# Patient Record
Sex: Female | Born: 1985 | Race: White | Hispanic: No | Marital: Single | Smoking: Never smoker
Health system: Southern US, Community
[De-identification: ages and names within clinical notes are randomized; demographics above are authoritative.]

## PROBLEM LIST (undated history)

## (undated) ENCOUNTER — Inpatient Hospital Stay (HOSPITAL_COMMUNITY): Payer: Self-pay

## (undated) DIAGNOSIS — I10 Essential (primary) hypertension: Secondary | ICD-10-CM

## (undated) HISTORY — PX: LEG SURGERY: SHX1003

---
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
## 2008-02-12 ENCOUNTER — Emergency Department (HOSPITAL_COMMUNITY): Admission: EM | Admit: 2008-02-12 | Discharge: 2008-02-12 | Payer: Self-pay | Admitting: Emergency Medicine

## 2008-03-04 ENCOUNTER — Emergency Department (HOSPITAL_COMMUNITY): Admission: EM | Admit: 2008-03-04 | Discharge: 2008-03-04 | Payer: Self-pay | Admitting: Emergency Medicine

## 2008-12-07 ENCOUNTER — Emergency Department (HOSPITAL_COMMUNITY): Admission: EM | Admit: 2008-12-07 | Discharge: 2008-12-07 | Payer: Self-pay | Admitting: Emergency Medicine

## 2010-05-13 LAB — URINALYSIS, ROUTINE W REFLEX MICROSCOPIC
Hgb urine dipstick: NEGATIVE
Ketones, ur: NEGATIVE mg/dL
Protein, ur: NEGATIVE mg/dL
Specific Gravity, Urine: 1.025 (ref 1.005–1.030)
pH: 6 (ref 5.0–8.0)

## 2010-05-13 LAB — GC/CHLAMYDIA PROBE AMP, GENITAL: GC Probe Amp, Genital: NEGATIVE

## 2010-05-13 LAB — WET PREP, GENITAL

## 2010-06-14 NOTE — Op Note (Signed)
NAME:  Nancy Morrison, QUASHIE NO.:  [id number]   MEDICAL RECORD NO.:  [id number]          PATIENT TYPE:  INP   LOCATION:  A410                          FACILITY:  [facility]   PHYSICIAN:  Langley Gauss, MD     DATE OF BIRTH:  05/31/1985   DATE OF PROCEDURE:  10/24/2004  DATE OF DISCHARGE:                                 OPERATIVE REPORT   DIAGNOSES:  1.  Intrauterine pregnancy at 40 weeks.  2.  Active outbreak of herpes simplex virus, type 2, recurrent.  40 week      intrauterine pregnancy.  3.  Is active rate of HSV type 2, recurrence at the patient's right inner,      remote from the vagina.  4.  Transient gestation hypertension of pregnancy with elevated blood      pressures noted during active stage of labor.  5.  Fetal macrosomia.   PROCEDURE:  1.  Placement of continuous lumbar epidural analgesia; start time 0300 on      October 24, 2004.  Discontinuation 0500 October 24, 2004.  Delivered      spontaneous delivery 8 pounds 13 ounces female infant.  2.  Midline episiotomy and repair.   SURGEON:  Langley Gauss, M.D.   ESTIMATED BLOOD LOSS:  Less than 500 cc.   COMPLICATIONS:  None.   SPECIMENS:  Arterial cord gas and cord blood to laboratory.  Placenta is  examined and noted be apparently intact with a three-vessel umbilical cord.   DESCRIPTION OF PROCEDURE:  Patient admitted for induction at [redacted] weeks  gestation on October 23, 2004.  Foley bulb catheter placed October 23, 2004.  This spontaneously fell out p.m. of October 23, 2004.  At the time  of amniotomy the patient noted be 5 cm dilatation.  This, however, is noted  to be passive dilatation through the efforts of the Foley bulb catheter as  it did require a significant amount of time beyond this with Pitocin  induction to achieve an adequate functional labor pain pattern.  The patient  initially did receive IV narcotics for pain relief but this was found to  provided inadequate analgesia.  She  was noted also to experience some  elevated blood pressures in the active phase of labor.  She was noted to be  negative for proteinuria upon admission and at all other times during the  prenatal course.  Thus she did not meet diagnostic criteria for  preeclampsia.  She did on three occasions receive 10 mg of IV Apresoline.  She did have a single maximally elevated blood pressure of 177/102, which  was obtained immediately during a contraction with significant pain  associated with this.  However, with placement of the epidural, the patient  had near normal placement of the was placed by the epidural the patient had  near normalization of blood pressures with no subsequent problem.  Epidural  was requested.  When I arrived to place the epidural, the nursing staff did  examine the patient and notified me she was 8 cm.  Epidural was placed out  difficulty. The patient  did have an urge to push while it was being placed  well as being placed, but after placement, she was examined noted be 9 cm  dilatation.  She did get an excellent bilateral block.  Subsequently, the  patient progressed rapidly through the transitional phase of labor. Upon  reaching complete dilatation, she had a strong urge to push.  She is placed  in the dorsal lithotomy position, prepped and draped in the usual sterile  manner.  Great care was taken to note the HSV type 2 lesion on the right the  inner thigh.  This was covered with a side. This is covered with a sterile  towel and thus the newborn did not come in contact with this.  By patient  history and upon clinical examination. there was noted to be no lesions  within the genital area.  The patient pushed well during the short second  stage of labor.  Scalp lead was cut and 10 cc of 1% lidocaine plain injected  in the midline of the peroneal body distension the perineum.  A small  midline episiotomy was performed.  The infant was delivered in a direct OA  position.   Mouth and nares were bulb suctioned of clear amniotic fluid.  Subsequently thereafter the patient pushed very poorly, thus there was  to  this there was some slight delay in delivery of the shoulders.  However,  when the patient did begin adequate expulsive efforts, spontaneous rotation  occurred to a left anterior shoulder position.  Gentle downward traction  combined with expulsive efforts resulted in delivery of this anterior  shoulder as well as the remainder the infant without difficulty.  V Mouth  and nares were again bulb suctioned of clear amniotic fluid. Umbilical cord  is milked toward the infant.  The cord was doubly clamped and cut.  Infant  is taken to the nursery staff for immediate assessment and is noted to have  a spontaneous and vigorous breathing and cry.  As previously, the left  shoulder was noted to be the anterior shoulder at the time of delivery.  The  infant appears to be moving both shoulders symmetrically.  After obtaining  arterial cord gas and cord blood, gentle traction umbilical cord results in  separation which upon examination appears to be an intact placenta,  associated three-vessel umbilical cord.  Examination of the genital tract  reveals midline episiotomy is not extended.  This was easily repaired  utilizing a 0 chromic in a running locked fashion on the vaginal mucosa  followed by a running layer of 2-0 Vicryl in the superficial transverse  perineal muscle.  The skin is completely closed utilizing a continuous  running layer of 0 chromic on the perineal body.  This restored the normal  anatomy.  The patient is taken out dorsal lithotomy position, rolled to her  side at which time the epidural catheter is removed with the blue tip noted  be intact.  Both mother and infant doing well following delivery.  The  patient does plan on attempting to both bottle and breast feed.  Ice pack is noted to the perineum, and she will be treated with p.o. Tylox for   postpartum analgesia.      Langley Gauss, MD  Electronically Signed     DC/MEDQ  D:  10/24/2004  T:  10/24/2004  Job:  [id number]

## 2010-06-14 NOTE — Op Note (Signed)
NAME:  Nancy Morrison, Nancy Morrison NO.:  [id number]   MEDICAL RECORD NO.:  [id number]          PATIENT TYPE:  INP   LOCATION:  LDR1                          FACILITY:  [facility]   PHYSICIAN:  Langley Gauss, MD     DATE OF BIRTH:  01/20/1986   DATE OF PROCEDURE:  10/23/2004  DATE OF DISCHARGE:                                 OPERATIVE REPORT   PROCEDURE:  Placement of Foley bulb for mechanical ripening of cervix prior  to amniotomy and induction. Procedure performed by Dr. Langley Gauss.   COMPLICATIONS:  None.   SPECIMENS:  None.   SUMMARY:  Appropriate informed consent was obtained. We had discussed  placement of Foley bulb for mechanical ripening during prenatal visits. The  patient is on external fetal monitor. She is noted to have no uterine  activity. Examination reveals normal uterine tone. Review of the external  fetal monitor strip does reveal fetal heart rate baseline of 150 with  accelerations noted greater than 15 beats per minute for greater than 15  seconds duration. No fetal heart rate decelerations. Normal long-term  variability.   ASSESSMENT:  Reactive nonstress test.   The patient had been examined two days previously in the office at which  time she was noted be about 1/2 cm dilated, vertex at a zero station, and  about 70% effaced. She is now placed in a dorsal dorsolithotomy position. A  sterile speculum examination is used to visualize the cervix. Notably, the  patient is noted be positive GBS carrier status. She is treated with IV  Cleocin at this time due to stated allergy to penicillin. The cervix  visualized. A soft 16-French Foley catheter is sterilely passed through the  endocervical os and then inflated to a volume of 50 cc of sterile normal  saline. Gentle traction does confirm a proper placement with the bulb of the  Foley just above the external cervical os. This was then taped to the  patient's left leg under gentle traction. The patient  tolerated the  procedure very well. She is taken out of the dorsal lithotomy position.  Review the external fetal monitor strip reveals no change in the fetal heart  rate. No change in uterine tone or uterine activity.   ASSESSMENT AND PLAN:  Will leave this in place for several hours' duration.  Hopefully upon removal, we will be able to proceed with amniotomy with a  much more favorable cervix for induction.      Langley Gauss, MD  Electronically Signed     DC/MEDQ  D:  10/23/2004  T:  10/23/2004  Job:  [id number]

## 2010-06-14 NOTE — Op Note (Signed)
NAME:  Nancy Morrison, FATE NO.:  [id number]   MEDICAL RECORD NO.:  [id number]          PATIENT TYPE:  INP   LOCATION:  A410                          FACILITY:  [facility]   PHYSICIAN:  Langley Gauss, MD     DATE OF BIRTH:  [birth year]   DATE OF PROCEDURE:  10/23/2004  DATE OF DISCHARGE:                                 OPERATIVE REPORT   The patient is evaluated at 28. She had a Foley bulb catheter placed in  the cervix previously. Just prior to my evaluation, the patient had been up  to use the rest room at which time the catheter had spontaneously fallen out  with the balloon still inflated. When I examination the patient, she was  noted be 5 cm dilated, 70% effaced, with the vertex at -1 station, posterior  but well applied to the cervix. Fetal scalp electrode was placed with  resultant amniotomy and findings of clear amniotic fluid and a reassuring  fetal heart rate. The patient is to be managed expectantly, subsequently  will receive Pitocin induction as clinically indicated. She is having  reassuring fetal heart rate by fetal scalp electrode at this time. She is  requesting initially IV narcotics for pain relief.      Langley Gauss, MD  Electronically Signed     DC/MEDQ  D:  10/24/2004  T:  10/24/2004  Job:  [id number]

## 2010-06-14 NOTE — Op Note (Signed)
NAME:  Nancy Morrison, HALK NO.:  [id number]   MEDICAL RECORD NO.:  [id number]          PATIENT TYPE:  INP   LOCATION:  A410                          FACILITY:  [facility]   PHYSICIAN:  Langley Gauss, MD     DATE OF BIRTH:  18-Feb-1985   DATE OF PROCEDURE:  DATE OF DISCHARGE:                                 OPERATIVE REPORT   PROCEDURE:  Placement of continuous lumbar epidural analgesia.   START TIME:  0300.   Appropriate informed consent was obtained.  The patient is having inadequate  therapy for relief from the IV narcotics, thus she requested epidural. She  is noted be 8 cm dilated per the nursing staff. She is placed in a seated  position. Bony landmarks identified. The back is sterilely prepped with the  epidural tray, 5 mL, 1% lidocaine injected at the midline and the L3-L4  interspace to raise a small skin wheal.  The 17 gauge Tuohy Schliff needle  was then utilized with loss resistance and air-filled glass syringe to  identify atraumatic entry in the epidural space. On the first attempt the  bony spinous process was encountered. Thus, the epidural needle was  withdrawn and directed slightly caudad. On the second attempt excellent loss  resistance was noted. No signs of CSF or intravascular entry obtained, 5 mL  1.5% lidocaine plus epinephrine injected through the epidural needle. No  signs of CSF, or intravascular injection obtained, thus the catheter was  inserted to depth of 5 cm.  Epidural needle was removed. Aspiration test was  negative, second test dose 2 mL 1.5% lidocaine plus epinephrine injected  through the epidural catheter. Again no signs of CSF or intravascular  injection obtained. Thus, the catheter was secured in place. The patient was  returned to the left lateral supine position, connected to the infusion pump  with the standard mixture at a rate of 14 mL/hour. Examination reveals 9 cm  dilatation, zero station completely effaced, with a reassuring  fetal heart  rate. The patient at this time appears to have excellent therapeutic results  with no urge to push at this time.      Langley Gauss, MD  Electronically Signed     DC/MEDQ  D:  10/24/2004  T:  10/24/2004  Job:  [id number]

## 2010-06-14 NOTE — Discharge Summary (Signed)
NAME:  Nancy Morrison, ROTTER NO.:  [id number]   MEDICAL RECORD NO.:  [id number]          PATIENT TYPE:  INP   LOCATION:  A410                          FACILITY:  [facility]   PHYSICIAN:  Langley Gauss, MD     DATE OF BIRTH:  08-13-1985   DATE OF ADMISSION:  10/23/2004  DATE OF DISCHARGE:  09/30/2006LH                                 DISCHARGE SUMMARY   She was given a copy of standard discharge instructions.  She is to follow  up in the office in four week's time.  She is bottle feeding at the time of  discharge.   PERTINENT DATES:  Date of admission, October 23, 2004;  placement of Foley  bulb, October 23, 2004;  placement of epidural, October 24, 2004;  vaginal delivery, September 28/2006;  discharge, October 26, 2004.   DIAGNOSES:  1.  Active recurrence of herpes simplex virus type 2 in the right inner      thigh, remote from the site of delivery.  2.  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  3.  Transient gestational hypertension of pregnancy with elevated blood      pressures noted during the active stage of labor and in the immediate      postpartum period.  The patient was treated in the postpartum state with      labetalol 100 mg p.o. b.i.d.  She had received during the course of      labor three 10 mg IV dosages of apresoline.   DELIVERED:  Viable vigorous female infant.   The patient has her coverage through [employer].  It is unclear whether this  provides circumcision as a covered service.  The patient herself reportedly  had no idea regarding coverage or non-coverage of said service, nor was she  able to make any financial arrangements at the time of the hospitalization.  Thus, infant's circumcision is not performed at time of discharge.  The  patient will have to make arrangements to follow up within the next two  weeks for performance of the circumcision.   PERTINENT LABORATORY STUDIES:  Hemoglobin 11.6, hematocrit 33.4, with a  white count 9.4.  On  postpartum day #1, 11/32.2 with a white count of 18.1.  Postpartum day #2, hemoglobin 11.5, hematocrit 32.9, with a white count of  14.2.   HOSPITAL COURSE:  See previous dictations.  The patient delivered in the  very early a.m. of October 24, 2004.  Postpartum, she did well.  She did  have some elevated blood pressures for which she was treated with the p.o.  labetalol,  and hospitalization continued x48 hours postpartum.  The patient  had good results with labetalol for her blood pressure, and she is now  discharged to home at this time in my absence by Tilda Burrow, M.D.  according to cross coverage arrangements.      Langley Gauss, MD  Electronically Signed     DC/MEDQ  D:  10/26/2004  T:  10/26/2004  Job:  [id number]

## 2010-06-14 NOTE — H&P (Signed)
NAME:  Nancy Morrison, NEIGHBORS NO.:  [id number]   MEDICAL RECORD NO.:  [id number]          PATIENT TYPE:  INP   LOCATION:  LDR1                          FACILITY:  [facility]   PHYSICIAN:  Langley Gauss, MD     DATE OF BIRTH:  15-Mar-1985   DATE OF ADMISSION:  DATE OF DISCHARGE:  LH                                HISTORY & PHYSICAL   HISTORY:  [age]-year-old gravida 1, para 0 at [redacted] weeks gestation was  admitted for induction of labor.  Patient's prenatal course has been  complicated by adolescent pregnancy, in addition she does not know the  father of the baby, she is only a Consulting civil engineer at [employer],  she is noted to be GBS carrier status positive, will require treatment  during the course of labor.  Initially her AFP extra triple screen was noted  to be abnormal however following an ultrasound at [facility] and a  corrected gestational age AP triple screen was noted to be within normal  limits.  Glucose tolerance test was normal at 114.   PAST MEDICAL HISTORY:  Patient does have a history of fracture of her left  ankle with no residual functional deficit.  Past medical history is  otherwise negative.  No other medical or surgical history.   ALLERGIES:  She has no known drug allergies.   CURRENT MEDICATIONS:  Prenatal vitamins.   SOCIAL HISTORY:  Patient is a nonsmoker.  Attends [employer].  As stated previously, she does not know who the father of the baby  is.   PHYSICAL EXAMINATION:  VITAL SIGNS:  Height 5 feet 8 inches.  Prepregnancy  is 203, most recent weight is 235.  Blood pressure is 141/94, pulse rate of  80, respiratory rate is 20.  HEENT:  Negative.  No adenopathy.  Neck is supple.  Thyroid is nonpalpable.  CHEST:  Lungs are clear.  Cardiovascular exam is regular rate and rhythm.  ABDOMEN:  Soft and nontender.  No surgical scars are identified.  EXTREMITIES:  Noted to be with only trace edema.  PELVIC EXAM:   Normal external genitalia.  No lesions or ulcerations  identified.  No vaginal bleeding or leakage of fluid.  Cervix is noted to be  fingertip, vertex at about a 0 station and about 70% effaced.  Fetal heart  tones auscultated in the 150s.  EXTREMITIES:  Patient complains of a sore on her right inner thigh.  Of  note, the area is near the right groin area but is clearly separate and  distinct from the labia and vaginal area.  It occurs down the medial aspect  of the right anterior thigh.  It is noted to actually be about 6 inches from  the groin fold.  It has very minimal weeping and no purulence identified.  Cultures performed of this area for HSV.  Patient describes one other  occasion where she had a similar type lesion.  She feels as though it  occurred on the other leg and attributes this current lesion to chafing of  the legs  together with walking.   ASSESSMENT AND PLAN:  Herpes simplex virus cultures performed of the sore on  the right inner thigh although it must be noted that this is remote from the  site of delivery thus the culture results obtained from this should not play  a component in the routed delivery.  Cervix is noted to be slightly  unfavorable thus she will be referred to [facility] on October 23, 2004 at 0800 hours at which time we can initially proceed with placement  of  Foley bulb for mechanical ripening of the cervix followed by amniotomy.  As  stated previously patient is noted to be GBS carrier status positive, will  require treatment during the course of labor in an effort to prevent  vertical transmission to the infant.      Langley Gauss, MD  Electronically Signed     DC/MEDQ  D:  10/21/2004  T:  10/21/2004  Job:  [id number]

## 2011-03-11 ENCOUNTER — Emergency Department (HOSPITAL_COMMUNITY): Payer: Medicaid Other

## 2011-03-11 ENCOUNTER — Emergency Department (HOSPITAL_COMMUNITY)
Admission: EM | Admit: 2011-03-11 | Discharge: 2011-03-11 | Disposition: A | Discharge status: Home / Self Care | Payer: Medicaid Other | Attending: Family Medicine | Admitting: Family Medicine

## 2011-03-11 ENCOUNTER — Encounter (HOSPITAL_COMMUNITY): Payer: Self-pay | Admitting: *Deleted

## 2011-03-11 DIAGNOSIS — L089 Local infection of the skin and subcutaneous tissue, unspecified: Secondary | ICD-10-CM | POA: Insufficient documentation

## 2011-03-11 DIAGNOSIS — M545 Low back pain, unspecified: Secondary | ICD-10-CM | POA: Insufficient documentation

## 2011-03-11 NOTE — ED Provider Notes (Signed)
History     CSN: [id number]  Arrival date & time 03/11/11  8657   First MD Initiated Contact with Patient 03/11/11 [phone]      Chief Complaint  Patient presents with  . drainage from umbilicus     (Consider location/radiation/quality/duration/timing/severity/associated sxs/prior treatment) HPI Comments: Pt notes a small amount of purulent d/c from umbilicus for the past few days.  She also has been having lower back pain since before christmas.  No trauma and no  UTI sxs.  No fever or chills.  The history is provided by the patient. No language interpreter was used.    History reviewed. No pertinent past medical history.  Past Surgical History  Procedure Date  . Leg surgery     left leg    No family history on file.  History  Substance Use Topics  . Smoking status: Never Smoker   . Smokeless tobacco: Not on file  . Alcohol Use: No    OB History    Grav Para Term Preterm Abortions TAB SAB Ect Mult Living                  Review of Systems  Constitutional: Negative for fever.  Musculoskeletal: Positive for back pain.  Skin: Positive for wound.  All other systems reviewed and are negative.    Allergies  Penicillins  Home Medications   Current Outpatient Rx  Name Route Sig Dispense Refill  . LEVONORGESTREL 20 MCG/24HR IU IUD Intrauterine 1 each by Intrauterine route once.      BP 125/79  Pulse 63  Temp(Src) 98.2 F (36.8 C) (Oral)  Resp 16  Ht 5\' 6"  (1.676 m)  Wt 262 lb (118.842 kg)  BMI 42.29 kg/m2  SpO2 99%  Physical Exam  Nursing note and vitals reviewed. Constitutional: She is oriented to person, place, and time. She appears well-developed and well-nourished. No distress.  HENT:  Head: Normocephalic and atraumatic.  Eyes: EOM are normal.  Neck: Normal range of motion.  Cardiovascular: Normal rate, regular rhythm and normal heart sounds.   Pulmonary/Chest: Effort normal and breath sounds normal.  Abdominal: Soft. She exhibits no distension.  There is no tenderness.    Musculoskeletal: Normal range of motion.       Back:  Neurological: She is alert and oriented to person, place, and time.  Skin: Skin is warm and dry.  Psychiatric: She has a normal mood and affect. Judgment normal.    ED Course  Procedures (including critical care time)  Labs Reviewed - No data to display No results found.   No diagnosis found.    MDM          Worthy Rancher, PA 03/11/11 1254

## 2011-03-11 NOTE — Discharge Instructions (Signed)
Back Pain, Adult Low back pain is very common. About 1 in 5 people have back pain.The cause of low back pain is rarely dangerous. The pain often gets better over time.About half of people with a sudden onset of back pain feel better in just 2 weeks. About 8 in 10 people feel better by 6 weeks.  CAUSES Some common causes of back pain include:  Strain of the muscles or ligaments supporting the spine.   Wear and tear (degeneration) of the spinal discs.   Arthritis.   Direct injury to the back.  DIAGNOSIS Most of the time, the direct cause of low back pain is not known.However, back pain can be treated effectively even when the exact cause of the pain is unknown.Answering your caregiver's questions about your overall health and symptoms is one of the most accurate ways to make sure the cause of your pain is not dangerous. If your caregiver needs more information, he or she may order lab work or imaging tests (X-rays or MRIs).However, even if imaging tests show changes in your back, this usually does not require surgery. HOME CARE INSTRUCTIONS For many people, back pain returns.Since low back pain is rarely dangerous, it is often a condition that people can learn to manageon their own.   Remain active. It is stressful on the back to sit or stand in one place. Do not sit, drive, or stand in one place for more than 30 minutes at a time. Take short walks on level surfaces as soon as pain allows.Try to increase the length of time you walk each day.   Do not stay in bed.Resting more than 1 or 2 days can delay your recovery.   Do not avoid exercise or work.Your body is made to move.It is not dangerous to be active, even though your back may hurt.Your back will likely heal faster if you return to being active before your pain is gone.   Pay attention to your body when you bend and lift. Many people have less discomfortwhen lifting if they bend their knees, keep the load close to their  bodies,and avoid twisting. Often, the most comfortable positions are those that put less stress on your recovering back.   Find a comfortable position to sleep. Use a firm mattress and lie on your side with your knees slightly bent. If you lie on your back, put a pillow under your knees.   Only take over-the-counter or prescription medicines as directed by your caregiver. Over-the-counter medicines to reduce pain and inflammation are often the most helpful.Your caregiver may prescribe muscle relaxant drugs.These medicines help dull your pain so you can more quickly return to your normal activities and healthy exercise.   Put ice on the injured area.   Put ice in a plastic bag.   Place a towel between your skin and the bag.   Leave the ice on for 15 to 20 minutes, 3 to 4 times a day for the first 2 to 3 days. After that, ice and heat may be alternated to reduce pain and spasms.   Ask your caregiver about trying back exercises and gentle massage. This may be of some benefit.   Avoid feeling anxious or stressed.Stress increases muscle tension and can worsen back pain.It is important to recognize when you are anxious or stressed and learn ways to manage it.Exercise is a great option.  SEEK MEDICAL CARE IF:  You have pain that is not relieved with rest or medicine.   You have   pain that does not improve in 1 week.   You have new symptoms.   You are generally not feeling well.  SEEK IMMEDIATE MEDICAL CARE IF:   You have pain that radiates from your back into your legs.   You develop new bowel or bladder control problems.   You have unusual weakness or numbness in your arms or legs.   You develop nausea or vomiting.   You develop abdominal pain.   You feel faint.  Document Released: 01/13/2005 Document Revised: 09/25/2010 Document Reviewed: 06/03/2010 [facility] Patient Information 2012 [place], [place].   Wash your belly-button twice daily with soap and water then apply  antibiotic ointment.  The lumbar x-rays are normal.  Follow up with your MD as needed.

## 2011-03-11 NOTE — ED Notes (Signed)
Yellow Drainage from umbilicus with foul smelling odor since yesterday.

## 2011-03-11 NOTE — ED Provider Notes (Signed)
Medical screening examination/treatment/procedure(s) were performed by non-physician practitioner and as supervising physician I was immediately available for consultation/collaboration. Devoria Albe, MD, Armando Gang   Ward Givens, MD 03/11/11 [phone]

## 2011-12-05 ENCOUNTER — Encounter (HOSPITAL_COMMUNITY): Payer: Self-pay | Admitting: *Deleted

## 2011-12-05 ENCOUNTER — Emergency Department (HOSPITAL_COMMUNITY)
Admission: EM | Admit: 2011-12-05 | Discharge: 2011-12-05 | Disposition: A | Discharge status: Home / Self Care | Payer: Medicaid Other | Attending: Emergency Medicine | Admitting: Emergency Medicine

## 2011-12-05 ENCOUNTER — Emergency Department (HOSPITAL_COMMUNITY): Payer: Medicaid Other

## 2011-12-05 DIAGNOSIS — N200 Calculus of kidney: Secondary | ICD-10-CM | POA: Insufficient documentation

## 2011-12-05 DIAGNOSIS — R112 Nausea with vomiting, unspecified: Secondary | ICD-10-CM | POA: Insufficient documentation

## 2011-12-05 LAB — WET PREP, GENITAL
Trich, Wet Prep: NONE SEEN
Yeast Wet Prep HPF POC: NONE SEEN

## 2011-12-05 LAB — COMPREHENSIVE METABOLIC PANEL
BUN: 12 mg/dL (ref 6–23)
Chloride: 99 mEq/L (ref 96–112)
Creatinine, Ser: 0.94 mg/dL (ref 0.50–1.10)
GFR calc Af Amer: >90 mL/min (ref >90)
GFR calc non Af Amer: 84 mL/min — ABNORMAL LOW (ref >90)
Glucose, Bld: 111 mg/dL — ABNORMAL HIGH (ref 70–99)
Potassium: 3.8 mEq/L (ref 3.5–5.1)
Total Bilirubin: 0.4 mg/dL (ref 0.3–1.2)

## 2011-12-05 LAB — CBC WITH DIFFERENTIAL/PLATELET
HCT: 38.9 % (ref 36.0–46.0)
Lymphs Abs: 1.8 10*3/uL (ref 0.7–4.0)
MCH: 29.8 pg (ref 26.0–34.0)
MCHC: 33.7 g/dL (ref 30.0–36.0)
MCV: 88.6 fL (ref 78.0–100.0)
Neutrophils Relative %: 74 % (ref 43–77)
Platelets: 280 10*3/uL (ref 150–400)
RBC: 4.39 MIL/uL (ref 3.87–5.11)
WBC: 12.9 10*3/uL — ABNORMAL HIGH (ref 4.0–10.5)

## 2011-12-05 LAB — URINE MICROSCOPIC-ADD ON

## 2011-12-05 LAB — URINALYSIS, ROUTINE W REFLEX MICROSCOPIC
Ketones, ur: NEGATIVE mg/dL
Protein, ur: NEGATIVE mg/dL
Specific Gravity, Urine: 1.02 (ref 1.005–1.030)
Urobilinogen, UA: 0.2 mg/dL (ref 0.0–1.0)

## 2011-12-05 MED ORDER — HYDROMORPHONE HCL PF 1 MG/ML IJ SOLN
1.0000 mg | Freq: Once | INTRAMUSCULAR | Status: AC
  Administered 2011-12-05: 1 mg via INTRAVENOUS
  Filled 2011-12-05: qty 1

## 2011-12-05 MED ORDER — OXYCODONE-ACETAMINOPHEN 5-325 MG PO TABS: 1.0000 | ORAL_TABLET | Freq: Four times a day (QID) | ORAL | Status: DC | PRN

## 2011-12-05 MED ORDER — ONDANSETRON HCL 4 MG/2ML IJ SOLN
4.0000 mg | Freq: Once | INTRAMUSCULAR | Status: AC
  Administered 2011-12-05: 4 mg via INTRAVENOUS
  Filled 2011-12-05: qty 2

## 2011-12-05 MED ORDER — SODIUM CHLORIDE 0.9 % IV BOLUS (SEPSIS)
1000.0000 mL | Freq: Once | INTRAVENOUS | Status: AC
  Administered 2011-12-05: 1000 mL via INTRAVENOUS

## 2011-12-05 NOTE — ED Notes (Signed)
Pt alert & oriented x4, stable gait. Patient given discharge instructions, paperwork & prescription(s). Patient  instructed to stop at the registration desk to finish any additional paperwork. Patient verbalized understanding. Pt left department w/ no further questions. 

## 2011-12-05 NOTE — ED Notes (Signed)
Pt states abdominal pain described as constant cramping which began at 1500. States she has vomited x 4 since pain began . Pt states pain radiates around back and into pelvic area.

## 2011-12-05 NOTE — ED Notes (Signed)
Patient transported to CT 

## 2011-12-05 NOTE — ED Provider Notes (Signed)
History   This chart was scribed for Nancy B. Bernette Mayers, MD by Toya Smothers, ED Scribe. The patient was seen in room APA04/APA04. Patient's care was started at 1709.  CSN: [id number]  Arrival date & time 12/05/11  1709   First MD Initiated Contact with Patient 12/05/11 1747      Chief Complaint  Patient presents with  . Abdominal Pain  . Emesis    HPI  Nancy Morrison is a 26 y.o. female who presents to the Emergency Department complaining of 3 hours of new, unchanged, constant, moderate generalized abdominal pain with associated vomiting. Onset occurred at rest. Pt denies injury mechanism. Pain is dull, radiating to the back, described as "a constant contraction," and neither alleviated or aggravated by anything. Symptoms have not been treated PTA. No diarrhea, fever, vaginal bleeding, vaginal discharge, chills, cough, congestion, rhinorrhea, chest pain, SOB. Medical Hx includes enlarged ovarian cysts. She is currently using Mirena birth control. Pt reports having normal Pap smear results in September.    History reviewed. No pertinent past medical history.  Past Surgical History  Procedure Date  . Leg surgery     left leg    No family history on file.  History  Substance Use Topics  . Smoking status: Never Smoker   . Smokeless tobacco: Not on file  . Alcohol Use: No    Review of Systems  Gastrointestinal: Positive for nausea, vomiting and abdominal pain. Negative for diarrhea, constipation and blood in stool.  Genitourinary: Negative for dysuria, hematuria, decreased urine volume, vaginal bleeding, vaginal discharge, difficulty urinating and vaginal pain.  All other systems reviewed and are negative.    Allergies  Penicillins  Home Medications   Current Outpatient Rx  Name  Route  Sig  Dispense  Refill  . PHENYLEPHRINE-DM-GG-APAP 5-10-200-325 MG PO TABS   Oral   Take 2 tablets by mouth 2 (two) times daily as needed. FOR COLD AND FLU         . LEVONORGESTREL 20  MCG/24HR IU IUD   Intrauterine   1 each by Intrauterine route once.           BP 148/89  Pulse 75  Temp 98.2 F (36.8 C) (Oral)  Resp 16  Ht 5\' 7"  (1.702 m)  Wt 262 lb (118.842 kg)  BMI 41.03 kg/m2  SpO2 100%  Physical Exam  Nursing note and vitals reviewed. Constitutional: She is oriented to person, place, and time. She appears well-developed and well-nourished.  HENT:  Head: Normocephalic and atraumatic.  Eyes: EOM are normal. Pupils are equal, round, and reactive to light.  Neck: Normal range of motion. Neck supple.  Cardiovascular: Normal rate, normal heart sounds and intact distal pulses.   Pulmonary/Chest: Effort normal and breath sounds normal.  Abdominal: Soft. Bowel sounds are normal. She exhibits no distension and no mass. There is no tenderness. There is no rebound and no guarding.  Genitourinary: Right adnexum displays no mass and no tenderness. Left adnexum displays no mass and no tenderness. No vaginal discharge found.       Unable to visualize cervix  Musculoskeletal: Normal range of motion. She exhibits no edema and no tenderness.  Neurological: She is alert and oriented to person, place, and time. She has normal strength. No cranial nerve deficit or sensory deficit.  Skin: Skin is warm and dry. No rash noted.  Psychiatric: She has a normal mood and affect.    ED Course  Procedures DIAGNOSTIC STUDIES: Oxygen Saturation is 100% on room  air, normal by my interpretation.    COORDINATION OF CARE: 18:03- Evaluated Pt. Pt is awake, alert, and without distress. 18:07- Ordered CBC with differential, Comprehensive Metabolic Panel, Lipase Blood, and Urine Culture. 19:58- Ordered CT Abdomen Pelvis Wo Contrast 1 time imaging.   Labs Reviewed  URINALYSIS, ROUTINE W REFLEX MICROSCOPIC - Abnormal; Notable for the following:    Hgb urine dipstick LARGE (*)     All other components within normal limits  CBC WITH DIFFERENTIAL - Abnormal; Notable for the following:     WBC 12.9 (*)     Neutro Abs 9.5 (*)     Monocytes Absolute 1.1 (*)     All other components within normal limits  COMPREHENSIVE METABOLIC PANEL - Abnormal; Notable for the following:    Sodium 133 (*)     Glucose, Bld 111 (*)     GFR calc non Af Amer 84 (*)     All other components within normal limits  URINE MICROSCOPIC-ADD ON - Abnormal; Notable for the following:    Squamous Epithelial / LPF MANY (*)     Bacteria, UA FEW (*)     All other components within normal limits  PREGNANCY, URINE  LIPASE, BLOOD  WET PREP, GENITAL  GC/CHLAMYDIA PROBE AMP  URINE CULTURE   Ct Abdomen Pelvis Wo Contrast  12/05/2011  *RADIOLOGY REPORT*  Clinical Data: Flank pain, hematuria, evaluate for renal stone  CT ABDOMEN AND PELVIS WITHOUT CONTRAST  Technique:  Multidetector CT imaging of the abdomen and pelvis was performed following the standard protocol without intravenous contrast.  Comparison: None.  Findings:  The lack of intravenous contrast limits the ability to evaluate solid abdominal organs.  There is an approximately 3 mm opacity in the expected location of the right UVJ, possibly within the adjacent the urinary bladder. This findings associated with mild upstream ureterectasis.  No definite right-sided pelvocaliectasis.  No additional renal stones are seen within either kidney or within the expected location of the left ureter.  Normal hepatic contour.  Normal noncontrast appearance of the gallbladder.  No ascites.  Normal noncontrast appearance of the bilateral adrenal glands, pancreas and spleen.  The bowel is normal in course and caliber without wall thickening or evidence of obstruction.  Normal appearance of the appendix.  No pneumoperitoneum, pneumatosis or portal venous gas.  Retroflexed uterus.  IUD within the uterus.  Possible 2.8 x 4.4 cm right side adnexal cyst.  No definite left-sided adnexal lesion. No free fluid in the pelvis.  Limited visualization of the lower thorax demonstrates minimal  bibasilar opacities favored to represent atelectasis.  No focal air space opacities.  No pleural effusion.  Normal heart size.  No pericardial effusion.  No acute or aggressive osseous abnormalities.  IMPRESSION: 1.  Approximately 3 mm stone within the expected location of the right UVJ, possibly passed into the adjacent urinary bladder.  This finding is associated with mild upstream ureterectasis. 2.  No other renal stones are identified.  No evidence of left- sided urinary obstruction.  3.  IUD within the uterus.  Possible right-sided adnexal cyst.   Original Report Authenticated By: Tacey Ruiz, MD      No diagnosis found.    MDM  UA with hematuria, CT shows renal stone. No other concerning abnormalities. Advised to take pain meds as needed and followup with Urology if symptoms persist.      I personally performed the services described in this documentation, which was scribed in my presence. The recorded information  has been reviewed and is accurate.         Nancy B. Bernette Mayers, MD 12/05/11 2132

## 2011-12-05 NOTE — ED Notes (Signed)
Patient unable to give urine specimen at this time.  Stated that she ask Triage nurse about giving a urine specimen and she stated for her to wait.  Patient began coughing and "peed on herself".    Advised patient that when she can give [place] a specimen to ring call bell.

## 2011-12-07 LAB — URINE CULTURE: Colony Count: 15000

## 2011-12-09 LAB — GC/CHLAMYDIA PROBE AMP, GENITAL: Chlamydia, DNA Probe: NEGATIVE

## 2012-08-26 ENCOUNTER — Emergency Department (HOSPITAL_COMMUNITY)
Admission: EM | Admit: 2012-08-26 | Discharge: 2012-08-27 | Disposition: A | Discharge status: Home / Self Care | Payer: Medicaid Other | Attending: Emergency Medicine | Admitting: Emergency Medicine

## 2012-08-26 ENCOUNTER — Encounter (HOSPITAL_COMMUNITY): Payer: Self-pay | Admitting: *Deleted

## 2012-08-26 DIAGNOSIS — Z79899 Other long term (current) drug therapy: Secondary | ICD-10-CM | POA: Insufficient documentation

## 2012-08-26 DIAGNOSIS — M25569 Pain in unspecified knee: Secondary | ICD-10-CM | POA: Insufficient documentation

## 2012-08-26 DIAGNOSIS — Z880 Allergy status to penicillin: Secondary | ICD-10-CM | POA: Insufficient documentation

## 2012-08-26 DIAGNOSIS — R42 Dizziness and giddiness: Secondary | ICD-10-CM | POA: Insufficient documentation

## 2012-08-26 DIAGNOSIS — R1110 Vomiting, unspecified: Secondary | ICD-10-CM | POA: Insufficient documentation

## 2012-08-26 DIAGNOSIS — R109 Unspecified abdominal pain: Secondary | ICD-10-CM | POA: Insufficient documentation

## 2012-08-26 DIAGNOSIS — M25561 Pain in right knee: Secondary | ICD-10-CM

## 2012-08-26 DIAGNOSIS — M549 Dorsalgia, unspecified: Secondary | ICD-10-CM

## 2012-08-26 MED ORDER — NAPROXEN 250 MG PO TABS: 500.0000 mg | ORAL_TABLET | Freq: Once | ORAL | Status: DC

## 2012-08-26 NOTE — ED Provider Notes (Signed)
CSN: [id number]     Arrival date & time 08/26/12  2148 History  This chart was scribed for [employer]. Colon Branch, MD by Bennett Scrape, ED Scribe. This patient was seen in room APA19/APA19 and the patient's care was started at 11:02 PM.   First MD Initiated Contact with Patient 08/26/12 2258     Chief Complaint  Patient presents with  . Abdominal Pain  . Dizziness  . Nausea    The history is provided by the patient. No language interpreter was used.    HPI Comments: Nancy Morrison is a 27 y.o. female who presents to the Emergency Department complaining of intermittent bilateral abdominal described as sharp with associated bilateral back pain that occur with lifting. She also reports lightheadedness but states that she holds her breath when she lifts. 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 Pt denies taking OTC medications at home to improve symptoms. She denies any other symptoms currently. Pt does not have a h/o chronic medical conditions.   History reviewed. No pertinent past medical history.  Past Surgical History  Procedure Laterality Date  . Leg surgery      left leg   No family history on file. History  Substance Use Topics  . Smoking status: Never Smoker   . Smokeless tobacco: Not on file  . Alcohol Use: No   No OB history provided.  Review of Systems  Constitutional: Negative for fever.       Per HPI, otherwise negative  HENT: Negative for congestion.   Eyes: Negative for discharge and redness.  Respiratory: Negative for cough and shortness of breath.   Cardiovascular: Negative for chest pain.  Gastrointestinal: Positive for abdominal pain. Negative for vomiting.  Genitourinary:       Neg aside from HPI   Musculoskeletal: Positive for back pain.  Skin: Negative for rash.  Neurological: Positive for light-headedness. Negative for syncope, numbness and headaches.  Psychiatric/Behavioral:       No  behavior change    Allergies  Lactose intolerance (gi) and Penicillins  Home Medications   Current Outpatient Rx  Name  Route  Sig  Dispense  Refill  . levonorgestrel (MIRENA) 20 MCG/24HR IUD   Intrauterine   1 each by Intrauterine route once.          Triage Vitals: BP 140/89  Pulse 83  Temp(Src) 98.3 F (36.8 C) (Oral)  Ht 5\' 5"  (1.651 m)  Wt 268 lb (121.564 kg)  BMI 44.6 kg/m2  SpO2 99%  Physical Exam  Nursing note and vitals reviewed. Constitutional: She is oriented to person, place, and time. She appears well-developed and well-nourished. No distress.  HENT:  Head: Normocephalic and atraumatic.  Eyes: Conjunctivae and EOM are normal.  Neck: Normal range of motion. Neck supple. No tracheal deviation present.  Cardiovascular: Normal rate, regular rhythm and normal heart sounds.   No murmur heard. Pulmonary/Chest: Effort normal and breath sounds normal. No respiratory distress. She has no wheezes. She has no rales.  Abdominal: Soft. Bowel sounds are normal. There is no tenderness.  Musculoskeletal: Normal range of motion. She exhibits no edema.  Neurological: She is alert and oriented to person, place, and time. No cranial nerve deficit.  Skin: Skin is warm and dry.  Psychiatric: She has a normal mood and affect. Her behavior is normal.    ED Course   Procedures (including critical care time)  DIAGNOSTIC STUDIES: Oxygen Saturation is  99% on room air, normal by my interpretation.    COORDINATION OF CARE: 11:06 PM-Informed pt that her symptoms are musculoskeletal. Discussed discharge plan which includes antiinflammatory and using proper body mechanics with pt and pt agreed to plan. Also advised pt to follow up as needed and pt agreed. Addressed symptoms to return for with pt.      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  Suggested use of antiinflammatories. She agreed to naproxen. Pt stable in ED with no significant deterioration in condition.The patient appears reasonably screened and/or stabilized for discharge and I doubt any other medical condition or other [facility] requiring further screening, evaluation, or treatment in the ED at this time prior to discharge.  I personally performed the services described in this documentation, which was scribed in my presence. The recorded information has been reviewed and considered.   MDM Reviewed: nursing note and vitals      Nicoletta Dress. Colon Branch, MD 08/27/12 [phone]

## 2012-09-18 ENCOUNTER — Encounter (HOSPITAL_COMMUNITY): Payer: Self-pay | Admitting: *Deleted

## 2012-09-18 ENCOUNTER — Emergency Department (HOSPITAL_COMMUNITY)
Admission: EM | Admit: 2012-09-18 | Discharge: 2012-09-18 | Disposition: A | Discharge status: Home / Self Care | Payer: Worker's Compensation | Attending: Emergency Medicine | Admitting: Emergency Medicine

## 2012-09-18 ENCOUNTER — Emergency Department (HOSPITAL_COMMUNITY): Payer: Worker's Compensation

## 2012-09-18 DIAGNOSIS — S63509A Unspecified sprain of unspecified wrist, initial encounter: Secondary | ICD-10-CM | POA: Insufficient documentation

## 2012-09-18 DIAGNOSIS — S63502A Unspecified sprain of left wrist, initial encounter: Secondary | ICD-10-CM

## 2012-09-18 DIAGNOSIS — Y9389 Activity, other specified: Secondary | ICD-10-CM | POA: Insufficient documentation

## 2012-09-18 DIAGNOSIS — X500XXA Overexertion from strenuous movement or load, initial encounter: Secondary | ICD-10-CM | POA: Insufficient documentation

## 2012-09-18 DIAGNOSIS — Z880 Allergy status to penicillin: Secondary | ICD-10-CM | POA: Insufficient documentation

## 2012-09-18 DIAGNOSIS — Y990 Civilian activity done for income or pay: Secondary | ICD-10-CM | POA: Insufficient documentation

## 2012-09-18 DIAGNOSIS — Y929 Unspecified place or not applicable: Secondary | ICD-10-CM | POA: Insufficient documentation

## 2012-09-18 MED ORDER — NAPROXEN 500 MG PO TABS: 500.0000 mg | ORAL_TABLET | Freq: Two times a day (BID) | ORAL | Status: DC

## 2012-09-18 MED ORDER — HYDROCODONE-ACETAMINOPHEN 5-325 MG PO TABS: 1.0000 | ORAL_TABLET | ORAL | Status: DC | PRN

## 2012-09-18 NOTE — ED Notes (Signed)
Pt c/o left wrist pain that started after her wrist was twisted when she attempted to keep pt from falling. Cms intact

## 2012-09-18 NOTE — ED Provider Notes (Signed)
CSN: [id number]     Arrival date & time 09/18/12  1606 History     First MD Initiated Contact with Patient 09/18/12 1629     Chief Complaint  Patient presents with  . Wrist Pain   (Consider location/radiation/quality/duration/timing/severity/associated sxs/prior Treatment) Patient is a 27 y.o. female presenting with wrist pain. The history is provided by the patient.  Wrist Pain This is a new problem. The problem occurs constantly. The problem has been unchanged. Pertinent negatives include no abdominal pain, chest pain, chills, fever, headaches, nausea, neck pain or vomiting. She has tried nothing for the symptoms.   Nancy Morrison is a 27 y.o. female who presents to the ED with left wrist pain. 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 She rates her pain as 6/10. History reviewed. No pertinent past medical history. Past Surgical History  Procedure Laterality Date  . Leg surgery      left leg   No family history on file. History  Substance Use Topics  . Smoking status: Never Smoker   . Smokeless tobacco: Not on file  . Alcohol Use: No   OB History   Grav Para Term Preterm Abortions TAB SAB Ect Mult Living                 Review of Systems  Constitutional: Negative for fever and chills.  HENT: Negative for neck pain.   Respiratory: Negative for shortness of breath and wheezing.   Cardiovascular: Negative for chest pain.  Gastrointestinal: Negative for nausea, vomiting and abdominal pain.  Musculoskeletal:       Left wrist pain  Skin: Negative for wound.  Neurological: Negative for dizziness and headaches.  Psychiatric/Behavioral: The patient is not nervous/anxious.     Allergies  Lactose intolerance (gi) and Penicillins  Home Medications   Current Outpatient Rx  Name  Route  Sig  Dispense  Refill  . levonorgestrel (MIRENA) 20 MCG/24HR IUD   Intrauterine   1 each by Intrauterine route once.            BP 153/93  Pulse 90  Temp(Src) 98.4 F (36.9 C) (Oral)  Resp 20  Ht 5\' 7"  (1.702 m)  Wt 267 lb (121.11 kg)  BMI 41.81 kg/m2  SpO2 100% Physical Exam  Nursing note and vitals reviewed. Constitutional: She is oriented to person, place, and time. She appears well-developed and well-nourished. No distress.  HENT:  Head: Normocephalic.  Eyes: EOM are normal.  Neck: Normal range of motion. Neck supple.  Cardiovascular: Normal rate.   Pulmonary/Chest: Effort normal.  Musculoskeletal:       Left wrist: She exhibits tenderness and swelling (minimal). She exhibits normal range of motion, no deformity and no laceration.       Arms: Tender dorsum of left wrist, radial pulses strong and equal. Adequate circulation, good touch sensation, good grip.    Neurological: She is alert and oriented to person, place, and time. She has normal strength. No cranial nerve deficit or sensory deficit.  Skin: Skin is warm and dry.  Psychiatric: She has a normal mood and affect. Her behavior is normal.    ED Course   Procedures  Labs Reviewed - No data to display Dg Wrist Complete Left  09/18/2012   *RADIOLOGY REPORT*  Clinical Data: Left wrist pain.  Swelling.  Lifting and twisting injury.  LEFT WRIST - COMPLETE 3+ VIEW  Comparison: None.  Findings: No fracture.  The joints normally spaced and aligned. The soft tissues are unremarkable.  IMPRESSION: Normal left wrist radiographs.   Original Report Authenticated By: Amie Portland, M.D.    MDM  27 y.o. female with left wrist sprain. Will apply wrist splint, ice, elevation and she will follow up with ortho as needed.  I have reviewed this patient's vital signs, nurses notes, appropriate imaging and discussed findings with the patient and plan of care. Patient voices understanding.  Worker's comp form completed.   Medication List    TAKE these medications       HYDROcodone-acetaminophen 5-325 MG per tablet  Commonly known as:  NORCO/VICODIN  Take 1  tablet by mouth every 4 (four) hours as needed.     naproxen 500 MG tablet  Commonly known as:  NAPROSYN  Take 1 tablet (500 mg total) by mouth 2 (two) times daily.      ASK your doctor about these medications       levonorgestrel 20 MCG/24HR IUD  Commonly known as:  MIRENA  1 each by Intrauterine route once.          [place], [place] 09/18/12 [phone]

## 2012-09-19 NOTE — ED Provider Notes (Signed)
Medical screening examination/treatment/procedure(s) were performed by non-physician practitioner and as supervising physician I was immediately available for consultation/collaboration.  Hurman Horn, MD 09/19/12 1255

## 2013-11-04 ENCOUNTER — Encounter (HOSPITAL_COMMUNITY): Payer: Self-pay | Admitting: Emergency Medicine

## 2013-11-04 ENCOUNTER — Emergency Department (HOSPITAL_COMMUNITY)
Admission: EM | Admit: 2013-11-04 | Discharge: 2013-11-04 | Disposition: A | Discharge status: Home / Self Care | Payer: Medicaid Other | Attending: Emergency Medicine | Admitting: Emergency Medicine

## 2013-11-04 DIAGNOSIS — K047 Periapical abscess without sinus: Secondary | ICD-10-CM | POA: Insufficient documentation

## 2013-11-04 DIAGNOSIS — J029 Acute pharyngitis, unspecified: Secondary | ICD-10-CM

## 2013-11-04 DIAGNOSIS — Z880 Allergy status to penicillin: Secondary | ICD-10-CM | POA: Insufficient documentation

## 2013-11-04 MED ORDER — TRAMADOL HCL 50 MG PO TABS: 50.0000 mg | ORAL_TABLET | Freq: Four times a day (QID) | ORAL | Status: DC | PRN

## 2013-11-04 MED ORDER — CLINDAMYCIN HCL 150 MG PO CAPS: 150.0000 mg | ORAL_CAPSULE | Freq: Four times a day (QID) | ORAL | Status: DC

## 2013-11-04 MED ORDER — IBUPROFEN 600 MG PO TABS: 600.0000 mg | ORAL_TABLET | Freq: Four times a day (QID) | ORAL | Status: DC | PRN

## 2013-11-04 NOTE — Discharge Instructions (Signed)
Dental Abscess A dental abscess is a collection of infected fluid (pus) from a bacterial infection in the inner part of the tooth (pulp). It usually occurs at the end of the tooth's root.  CAUSES   Severe tooth decay.  Trauma to the tooth that allows bacteria to enter into the pulp, such as a broken or chipped tooth. SYMPTOMS   Severe pain in and around the infected tooth.  Swelling and redness around the abscessed tooth or in the mouth or face.  Tenderness.  Pus drainage.  Bad breath.  Bitter taste in the mouth.  Difficulty swallowing.  Difficulty opening the mouth.  Nausea.  Vomiting.  Chills.  Swollen neck glands. DIAGNOSIS   A medical and dental history will be taken.  An examination will be performed by tapping on the abscessed tooth.  X-rays may be taken of the tooth to identify the abscess. TREATMENT The goal of treatment is to eliminate the infection. You may be prescribed antibiotic medicine to stop the infection from spreading. A root canal may be performed to save the tooth. If the tooth cannot be saved, it may be pulled (extracted) and the abscess may be drained.  HOME CARE INSTRUCTIONS  Only take over-the-counter or prescription medicines for pain, fever, or discomfort as directed by your caregiver.  Rinse your mouth (gargle) often with salt water ( tsp salt in 8 oz [250 ml] of warm water) to relieve pain or swelling.  Do not drive after taking pain medicine (narcotics).  Do not apply heat to the outside of your face.  Return to your dentist for further treatment as directed. SEEK MEDICAL CARE IF:  Your pain is not helped by medicine.  Your pain is getting worse instead of better. SEEK IMMEDIATE MEDICAL CARE IF:  You have a fever or persistent symptoms for more than 2-3 days.  You have a fever and your symptoms suddenly get worse.  You have chills or a very bad headache.  You have problems breathing or swallowing.  You have trouble  opening your mouth.  You have swelling in the neck or around the eye. Document Released: 01/13/2005 Document Revised: 10/08/2011 Document Reviewed: 04/23/2010 [facility] Patient Information 2015 [employer], [place]. This information is not intended to replace advice given to you by your health care provider. Make sure you discuss any questions you have with your health care provider.  Pharyngitis Pharyngitis is redness, pain, and swelling (inflammation) of your pharynx.  CAUSES  Pharyngitis is usually caused by infection. Most of the time, these infections are from viruses (viral) and are part of a cold. However, sometimes pharyngitis is caused by bacteria (bacterial). Pharyngitis can also be caused by allergies. Viral pharyngitis may be spread from person to person by coughing, sneezing, and personal items or utensils (cups, forks, spoons, toothbrushes). Bacterial pharyngitis may be spread from person to person by more intimate contact, such as kissing.  SIGNS AND SYMPTOMS  Symptoms of pharyngitis include:   Sore throat.   Tiredness (fatigue).   Low-grade fever.   Headache.  Joint pain and muscle aches.  Skin rashes.  Swollen lymph nodes.  Plaque-like film on throat or tonsils (often seen with bacterial pharyngitis). DIAGNOSIS  Your health care provider will ask you questions about your illness and your symptoms. Your medical history, along with a physical exam, is often all that is needed to diagnose pharyngitis. Sometimes, a rapid strep test is done. Other lab tests may also be done, depending on the suspected cause.  TREATMENT  Viral pharyngitis will usually get better in 3-4 days without the use of medicine. Bacterial pharyngitis is treated with medicines that kill germs (antibiotics).  HOME CARE INSTRUCTIONS   Drink enough water and fluids to keep your urine clear or pale yellow.   Only take over-the-counter or prescription medicines as directed by your health care provider:    If you are prescribed antibiotics, make sure you finish them even if you start to feel better.   Do not take aspirin.   Get lots of rest.   Gargle with 8 oz of salt water ( tsp of salt per 1 qt of water) as often as every 1-2 hours to soothe your throat.   Throat lozenges (if you are not at risk for choking) or sprays may be used to soothe your throat. SEEK MEDICAL CARE IF:   You have large, tender lumps in your neck.  You have a rash.  You cough up green, yellow-brown, or bloody spit. SEEK IMMEDIATE MEDICAL CARE IF:   Your neck becomes stiff.  You drool or are unable to swallow liquids.  You vomit or are unable to keep medicines or liquids down.  You have severe pain that does not go away with the use of recommended medicines.  You have trouble breathing (not caused by a stuffy nose). MAKE SURE YOU:   Understand these instructions.  Will watch your condition.  Will get help right away if you are not doing well or get worse. Document Released: 01/13/2005 Document Revised: 11/03/2012 Document Reviewed: 09/20/2012 [facility] Patient Information 2015 [employer], [place]. This information is not intended to replace advice given to you by your health care provider. Make sure you discuss any questions you have with your health care provider.   Take the entire course of antibiotics.  Use ibuprofen for throat pain and dental pain.  You also been prescribed tramadol which he can take for the next 3 days for better pain control as this dental infection is improving.  Do not drive within 4 hours of taking this medication as it may make you drowsy.  Severe dental referrals sheet and call to arrange for dental care followup.

## 2013-11-04 NOTE — ED Notes (Signed)
Pt c/o sore throat and left upper toothache.

## 2013-11-04 NOTE — ED Provider Notes (Signed)
Medical screening examination/treatment/procedure(s) were performed by non-physician practitioner and as supervising physician I was immediately available for consultation/collaboration.   EKG Interpretation None       Joya Gaskinsonald W Lanya Bucks, MD 11/04/13 1133

## 2013-11-04 NOTE — ED Provider Notes (Signed)
CSN: [id number]     Arrival date & time 11/04/13  1004 History   First MD Initiated Contact with Patient 11/04/13 1042     Chief Complaint  Patient presents with  . Sore Throat     (Consider location/radiation/quality/duration/timing/severity/associated sxs/prior Treatment) The history is provided by the patient.   Nancy ReichertClay B Kurihara is a 28 y.o. female with 2 complaints, the first being toothache, the second sore throat.  She describes a several day history of left upper second molar tooth pain along with gingival swelling.  She feels that she has swelling into her left cheek.  She has had no drainage from around the tooth, she has known cavity in this tooth, does not have a dentist so have this issue resolved.  She is taking no medications for this prior to arrival.  Additionally, woke with sore throat this morning with painful swallowing.  She denies nausea or vomiting, nasal congestion, ear pain, cough or shortness of breath.  Again, she has taken no medications prior to arrival and has found no alleviators.  Swallowing makes her symptoms worse.     History reviewed. No pertinent past medical history. Past Surgical History  Procedure Laterality Date  . Leg surgery      left leg   No family history on file. History  Substance Use Topics  . Smoking status: Never Smoker   . Smokeless tobacco: Not on file  . Alcohol Use: No   OB History   Grav Para Term Preterm Abortions TAB SAB Ect Mult Living                 Review of Systems  Constitutional: Negative for fever and chills.  HENT: Positive for dental problem, facial swelling and sore throat. Negative for postnasal drip, rhinorrhea and sinus pressure.   Respiratory: Negative for cough and shortness of breath.   Musculoskeletal: Negative for neck pain and neck stiffness.      Allergies  Lactose intolerance (gi) and Penicillins  Home Medications   Prior to Admission medications   Medication Sig Start Date End Date Taking?  Authorizing Provider  levonorgestrel (MIRENA) 20 MCG/24HR IUD 1 each by Intrauterine route once.   Yes Historical Provider, MD  clindamycin (CLEOCIN) 150 MG capsule Take 1 capsule (150 mg total) by mouth every 6 (six) hours. 11/04/13   Burgess AmorJulie Anjelo Pullman, PA-C  ibuprofen (ADVIL,MOTRIN) 600 MG tablet Take 1 tablet (600 mg total) by mouth every 6 (six) hours as needed. 11/04/13   Burgess AmorJulie Kamilo Och, PA-C  traMADol (ULTRAM) 50 MG tablet Take 1 tablet (50 mg total) by mouth every 6 (six) hours as needed. 11/04/13   Burgess AmorJulie Elva Mauro, PA-C   BP 145/93  Pulse 70  Temp(Src) 98.3 F (36.8 C)  Resp 18  Ht 5\' 7"  (1.702 m)  Wt 277 lb 9 oz (125.902 kg)  BMI 43.46 kg/m2  SpO2 99% Physical Exam  Constitutional: She is oriented to person, place, and time. She appears well-developed and well-nourished. No distress.  HENT:  Head: Normocephalic and atraumatic.  Right Ear: Tympanic membrane and external ear normal.  Left Ear: Tympanic membrane and external ear normal.  Nose: Nose normal.  Mouth/Throat: Oropharynx is clear and moist and mucous membranes are normal. No oral lesions. Dental abscesses present.    Deep cavity on the occlusal surface of her left upper second molar.  She does have surrounding gingival edema and erythema, there is no fluctuance.  There is no appreciable edema in her left cheek, no induration or  erythema, no fluctuance.  She does have very mild posterior pharyngeal edema.  There is no tonsillar hypertrophy or exudates.  Uvula is midline.  Eyes: Conjunctivae are normal.  Neck: Normal range of motion. Neck supple.  Cardiovascular: Normal rate and normal heart sounds.   Pulmonary/Chest: Effort normal. No stridor. She has no decreased breath sounds. She has no wheezes. She has no rhonchi.  Abdominal: She exhibits no distension.  Musculoskeletal: Normal range of motion.  Lymphadenopathy:    She has no cervical adenopathy.  Neurological: She is alert and oriented to person, place, and time.  Skin: Skin is  warm and dry. No erythema.  Psychiatric: She has a normal mood and affect.    ED Course  Procedures (including critical care time) Labs Review Labs Reviewed - No data to display  Imaging Review No results found.   EKG Interpretation None      MDM   Final diagnoses:  Dental infection  Pharyngitis    Dental decay with early abscess,  Mild posterior pharyngeal erythema.  Prescribe prescription strength ibuprofen, clindamycin given her penicillin allergy.  She was given dental referral sheet.  Encouraged soft to liquid diet until pain symptoms improve.  There is no sign of any drainable abscess on her exam today.    Burgess AmorJulie Jatasia Gundrum, PA-C 11/04/13 1113

## 2015-04-14 ENCOUNTER — Encounter (HOSPITAL_COMMUNITY): Payer: Self-pay | Admitting: *Deleted

## 2015-04-14 ENCOUNTER — Emergency Department (HOSPITAL_COMMUNITY)
Admission: EM | Admit: 2015-04-14 | Discharge: 2015-04-14 | Disposition: A | Discharge status: Home / Self Care | Payer: BLUE CROSS/BLUE SHIELD | Attending: Emergency Medicine | Admitting: Emergency Medicine

## 2015-04-14 ENCOUNTER — Emergency Department (HOSPITAL_COMMUNITY): Payer: BLUE CROSS/BLUE SHIELD

## 2015-04-14 DIAGNOSIS — M792 Neuralgia and neuritis, unspecified: Secondary | ICD-10-CM | POA: Insufficient documentation

## 2015-04-14 DIAGNOSIS — I10 Essential (primary) hypertension: Secondary | ICD-10-CM | POA: Diagnosis not present

## 2015-04-14 DIAGNOSIS — R531 Weakness: Secondary | ICD-10-CM | POA: Diagnosis present

## 2015-04-14 HISTORY — DX: Essential (primary) hypertension: I10

## 2015-04-14 LAB — CBG MONITORING, ED: Glucose-Capillary: 109 mg/dL — ABNORMAL HIGH (ref 65–99)

## 2015-04-14 MED ORDER — NAPROXEN 500 MG PO TABS: 500.0000 mg | ORAL_TABLET | Freq: Two times a day (BID) | ORAL | Status: DC

## 2015-04-14 MED ORDER — TRAMADOL HCL 50 MG PO TABS: 50.0000 mg | ORAL_TABLET | Freq: Four times a day (QID) | ORAL | Status: DC | PRN

## 2015-04-14 NOTE — Discharge Instructions (Signed)
Follow up if not improving

## 2015-04-14 NOTE — ED Provider Notes (Signed)
CSN: [id number]     Arrival date & time 04/14/15  2122 History  By signing my name below, I, Budd PalmerVanessa Prueter, attest that this documentation has been prepared under the direction and in the presence of Bethann BerkshireJoseph Tanganyika Bowlds, MD. Electronically Signed: Budd PalmerVanessa Prueter, ED Scribe. 04/14/2015. 9:49 PM.      Chief Complaint  Patient presents with  . Extremity Weakness   Patient is a 30 y.o. female presenting with extremity weakness. The history is provided by the patient. No language interpreter was used.  Extremity Weakness This is a new problem. The current episode started 6 to 12 hours ago. The problem occurs constantly. The problem has been gradually worsening. Pertinent negatives include no chest pain, no abdominal pain and no headaches. Nothing relieves the symptoms. She has tried nothing for the symptoms.   HPI Comments: Nancy Morrison is a 30 y.o. female with a PMHX of HTN who presents to the Emergency Department complaining of weakness to the LUE onset this morning. Pt states when she woke up this morning, her left eye was painful and itching. As the day progressed, she began to have pain in her left face, left neck, and left upper arm, which then developed into a tingling sensation along the entire extremity. She notes associated intermittent blurred vision of the left eye. 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  Pt denies any numbness.   Past Medical History  Diagnosis Date  . Hypertension    Past Surgical History  Procedure Laterality Date  . Leg surgery      left leg   History reviewed. No pertinent family history. Social History  Substance Use Topics  . Smoking status: Never Smoker   . Smokeless tobacco: None  . Alcohol Use: No   OB History    No data available     Review of Systems  Constitutional: Negative for appetite change and fatigue.  HENT: Negative for congestion, ear discharge and sinus pressure.   Eyes: Positive for pain, itching and visual  disturbance. Negative for discharge.  Respiratory: Negative for cough.   Cardiovascular: Negative for chest pain.  Gastrointestinal: Negative for abdominal pain and diarrhea.  Genitourinary: Negative for frequency and hematuria.  Musculoskeletal: Positive for myalgias, extremity weakness and neck pain. Negative for back pain.  Skin: Negative for rash.  Neurological: Positive for weakness. Negative for seizures, numbness and headaches.  Psychiatric/Behavioral: Negative for hallucinations.    Allergies  Lactose intolerance (gi) and Penicillins  Home Medications   Prior to Admission medications   Medication Sig Start Date End Date Taking? Authorizing Provider  levonorgestrel (MIRENA) 20 MCG/24HR IUD 1 each by Intrauterine route once.   Yes Historical Provider, MD   BP 132/85 mmHg  Pulse 78  Temp(Src) 97.7 F (36.5 C) (Oral)  Resp 16  Ht 5\' 5"  (1.651 m)  Wt 277 lb (125.646 kg)  BMI 46.10 kg/m2  SpO2 98% Physical Exam  Constitutional: She is oriented to person, place, and time. She appears well-developed.  HENT:  Head: Normocephalic.  Eyes: Conjunctivae and EOM are normal. No scleral icterus.  Neck: Neck supple. Muscular tenderness present. No thyromegaly present.  TTP left lateral neck  Cardiovascular: Normal rate and regular rhythm.  Exam reveals no gallop and no friction rub.   No murmur heard. Pulmonary/Chest: No stridor. She has no wheezes. She has no rales. She exhibits no tenderness.  Abdominal: She exhibits no distension. There is no tenderness. There is no rebound.  Musculoskeletal: Normal  range of motion. She exhibits tenderness. She exhibits no edema.  Left shoulder, elbow, and wrist TTP, as well as tenderness with flexion  Lymphadenopathy:    She has no cervical adenopathy.  Neurological: She is alert and oriented to person, place, and time. She exhibits normal muscle tone. Coordination normal.  Skin: No rash noted. No erythema.  Psychiatric: She has a normal mood  and affect. Her behavior is normal.    ED Course  Procedures  DIAGNOSTIC STUDIES: Oxygen Saturation is 98% on RA, normal by my interpretation.    COORDINATION OF CARE: 9:40 PM - Discussed probable muscle inflammation and plans to order diagnostic imaging. Pt advised of plan for treatment and pt agrees.  Labs Review Labs Reviewed  CBG MONITORING, ED - Abnormal; Notable for the following:    Glucose-Capillary 109 (*)    All other components within normal limits    Imaging Review Ct Head Wo Contrast  04/14/2015  CLINICAL DATA:  WOKE UP THIS AM WITH LEFT ARM PAIN, DENIES INJURY. As the day progressed, she began to have pain in her left face, left neck, and left upper arm, which then developed into a tingling sensation along the entire extremity. EXAM: CT HEAD WITHOUT CONTRAST CT CERVICAL SPINE WITHOUT CONTRAST TECHNIQUE: Multidetector CT imaging of the head and cervical spine was performed following the standard protocol without intravenous contrast. Multiplanar CT image reconstructions of the cervical spine were also generated. COMPARISON:  None. FINDINGS: CT HEAD FINDINGS There is no intra or extra-axial fluid collection or mass lesion. The basilar cisterns and ventricles have a normal appearance. There is no CT evidence for acute infarction or hemorrhage. There is no calvarial fracture. There is opacification of scattered paranasal sinuses. No air-fluid levels are identified within the sinuses. Mastoid air cells are normally aerated. CT CERVICAL SPINE FINDINGS There is loss of cervical lordosis. This may be secondary to splinting, soft tissue injury, or positioning. Otherwise alignment is normal. No acute fracture or subluxation. IMPRESSION: 1.  No evidence for acute intracranial abnormality. 2. 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 3.  No evidence for acute cervical spine fracture. 4. Loss of cervical spine lordosis. Electronically Signed   By: Norva Pavlov M.D.   On: 04/14/2015  22:26   Ct Cervical Spine Wo Contrast  04/14/2015  CLINICAL DATA:  WOKE UP THIS AM WITH LEFT ARM PAIN, DENIES INJURY. As the day progressed, she began to have pain in her left face, left neck, and left upper arm, which then developed into a tingling sensation along the entire extremity. EXAM: CT HEAD WITHOUT CONTRAST CT CERVICAL SPINE WITHOUT CONTRAST TECHNIQUE: Multidetector CT imaging of the head and cervical spine was performed following the standard protocol without intravenous contrast. Multiplanar CT image reconstructions of the cervical spine were also generated. COMPARISON:  None. FINDINGS: CT HEAD FINDINGS There is no intra or extra-axial fluid collection or mass lesion. The basilar cisterns and ventricles have a normal appearance. There is no CT evidence for acute infarction or hemorrhage. There is no calvarial fracture. There is opacification of scattered paranasal sinuses. No air-fluid levels are identified within the sinuses. Mastoid air cells are normally aerated. CT CERVICAL SPINE FINDINGS There is loss of cervical lordosis. This may be secondary to splinting, soft tissue injury, or positioning. Otherwise alignment is normal. No acute fracture or subluxation. IMPRESSION: 1.  No evidence for acute intracranial abnormality. 2. 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 3.  No evidence for acute cervical spine fracture.  4. Loss of cervical spine lordosis. Electronically Signed   By: Norva Pavlov M.D.   On: 04/14/2015 22:26   I have personally reviewed and evaluated these images and lab results as part of my medical decision-making.   EKG Interpretation None      MDM   Final diagnoses:  None    CT head and cervical spine unremarkable. 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 Will treat patient with Naprosyn and also tramadol have her follow-up as needed.j The chart was scribed for me under my direct  supervision.  I personally performed the history, physical, and medical decision making and all procedures in the evaluation of this patient.Bethann Berkshire, MD 04/14/15 [phone]

## 2015-04-14 NOTE — ED Notes (Signed)
Pt states she woke up and her left eye was hurting and itching when she woke up. Pt also reports a numbness/tingling feeling in her left arm when she was at work this morning around 9:30am. Pt states symptoms have continued all day.

## 2017-09-17 ENCOUNTER — Emergency Department (HOSPITAL_COMMUNITY)
Admission: EM | Admit: 2017-09-17 | Discharge: 2017-09-18 | Disposition: A | Discharge status: Home / Self Care | Payer: Medicaid Other | Attending: Emergency Medicine | Admitting: Emergency Medicine

## 2017-09-17 DIAGNOSIS — Y998 Other external cause status: Secondary | ICD-10-CM | POA: Diagnosis not present

## 2017-09-17 DIAGNOSIS — I10 Essential (primary) hypertension: Secondary | ICD-10-CM | POA: Diagnosis not present

## 2017-09-17 DIAGNOSIS — Z3A01 Less than 8 weeks gestation of pregnancy: Secondary | ICD-10-CM | POA: Diagnosis not present

## 2017-09-17 DIAGNOSIS — S70361A Insect bite (nonvenomous), right thigh, initial encounter: Secondary | ICD-10-CM | POA: Diagnosis not present

## 2017-09-17 DIAGNOSIS — L03317 Cellulitis of buttock: Secondary | ICD-10-CM | POA: Diagnosis not present

## 2017-09-17 DIAGNOSIS — O9989 Other specified diseases and conditions complicating pregnancy, childbirth and the puerperium: Secondary | ICD-10-CM | POA: Insufficient documentation

## 2017-09-17 DIAGNOSIS — Y929 Unspecified place or not applicable: Secondary | ICD-10-CM | POA: Diagnosis not present

## 2017-09-17 DIAGNOSIS — W57XXXA Bitten or stung by nonvenomous insect and other nonvenomous arthropods, initial encounter: Secondary | ICD-10-CM | POA: Insufficient documentation

## 2017-09-17 DIAGNOSIS — Y9389 Activity, other specified: Secondary | ICD-10-CM | POA: Diagnosis not present

## 2017-09-18 ENCOUNTER — Encounter (HOSPITAL_COMMUNITY): Payer: Self-pay

## 2017-09-18 ENCOUNTER — Other Ambulatory Visit: Payer: Self-pay

## 2017-09-18 LAB — POC URINE PREG, ED: PREG TEST UR: POSITIVE — AB

## 2017-09-18 MED ORDER — CLINDAMYCIN HCL 150 MG PO CAPS
450.0000 mg | ORAL_CAPSULE | Freq: Once | ORAL | Status: AC
  Administered 2017-09-18: 450 mg via ORAL
  Filled 2017-09-18: qty 3

## 2017-09-18 MED ORDER — PRENATAL VITAMIN 27-0.8 MG PO TABS: 1.0000 | ORAL_TABLET | Freq: Every day | ORAL | 0 refills | Status: DC

## 2017-09-18 MED ORDER — CLINDAMYCIN HCL 150 MG PO CAPS: 450.0000 mg | ORAL_CAPSULE | Freq: Three times a day (TID) | ORAL | 0 refills | Status: DC

## 2017-09-18 NOTE — ED Triage Notes (Signed)
Pt arrived via POV c/o insect bite X2 Days. Pt states it has grown each day since. Unsure of what bit her.

## 2017-09-18 NOTE — ED Notes (Signed)
ED Provider at bedside. 

## 2017-09-18 NOTE — ED Provider Notes (Signed)
[facility] Provider Note   CSN: [id number] Arrival date & time: 09/17/17  2352     History   Chief Complaint Chief Complaint  Patient presents with  . Insect Bite    HPI Nancy Morrison is a 32 y.o. female presenting with an itchy, red suspected insect bite on her right posterior upper thigh which started 2 days ago.  She denies fevers, chills, nausea, abdominal pain and denies radiation of pain from the site which is described as tender.  She has had no treatment prior to arrival.  Additionally, states according to her period tracker app she is 5 days late for her period.  She reports having her IUD removed mid June and is actively trying to get pregnant. Her LMP was July 17 and normal.  She denies pelvic pain, cramping, vaginal dc.  HPI  Past Medical History:  Diagnosis Date  . Hypertension     There are no active problems to display for this patient.   Past Surgical History:  Procedure Laterality Date  . LEG SURGERY     left leg     OB History    Gravida  2   Para  2   Term  2   Preterm  0   AB  0   Living  2     SAB      TAB      Ectopic      Multiple      Live Births               Home Medications    Prior to Admission medications   Medication Sig Start Date End Date Taking? Authorizing Provider  clindamycin (CLEOCIN) 150 MG capsule Take 3 capsules (450 mg total) by mouth 3 (three) times daily. 09/18/17   Burgess AmorIdol, Nisaiah Bechtol, PA-C  levonorgestrel (MIRENA) 20 MCG/24HR IUD 1 each by Intrauterine route once.    [provider]  naproxen (NAPROSYN) 500 MG tablet Take 1 tablet (500 mg total) by mouth 2 (two) times daily. 04/14/15   Bethann BerkshireZammit, Joseph, MD  Prenatal Vit-Fe Fumarate-FA (PRENATAL VITAMIN) 27-0.8 MG TABS Take 1 tablet by mouth daily. 09/18/17   Burgess AmorIdol, Kemoni Quesenberry, PA-C  traMADol (ULTRAM) 50 MG tablet Take 1 tablet (50 mg total) by mouth every 6 (six) hours as needed. 04/14/15   Bethann BerkshireZammit, Joseph, MD    Family History No  family history on file.  Social History Social History   Tobacco Use  . Smoking status: Never Smoker  . Smokeless tobacco: Never Used  Substance Use Topics  . Alcohol use: No  . Drug use: No     Allergies   Lactose intolerance (gi) and Penicillins   Review of Systems Review of Systems  Constitutional: Negative for chills and fever.  Respiratory: Negative.   Cardiovascular: Negative.   Gastrointestinal: Negative for abdominal pain, nausea and vomiting.  Genitourinary: Negative.   Skin: Positive for color change and wound.  Neurological: Negative for numbness.     Physical Exam Updated Vital Signs BP 124/84   Pulse 97   Temp 98.7 F (37.1 C) (Oral)   Resp 18   Ht 5\' 8"  (1.727 m)   Wt 106.6 kg   LMP 08/21/2017 (Exact Date)   SpO2 98%   BMI 35.73 kg/m   Physical Exam  Constitutional: She appears well-developed and well-nourished. No distress.  HENT:  Head: Normocephalic.  Neck: Neck supple.  Cardiovascular: Normal rate.  Pulmonary/Chest: Effort normal. She has no wheezes.  Abdominal: Soft. Bowel sounds are normal.  Musculoskeletal: Normal range of motion. She exhibits no edema.  Skin: Lesion noted. There is erythema.  8 cm area of macular erythema with a central small area of clear fluid filled vesicles at the right gluteal fold/posterior upper thigh. No drainage, no fluctuance or induration. No red streaking.      ED Treatments / Results  Labs (all labs ordered are listed, but only abnormal results are displayed) Labs Reviewed  POC URINE PREG, ED - Abnormal; Notable for the following components:      Result Value   Preg Test, Ur POSITIVE (*)    All other components within normal limits    EKG None  Radiology No results found.  Procedures Procedures (including critical care time)  Medications Ordered in ED Medications  clindamycin (CLEOCIN) capsule 450 mg (450 mg Oral Given 09/18/17 0054)     Initial Impression / Assessment and Plan / ED  Course  I have reviewed the triage vital signs and the nursing notes.  Pertinent labs & imaging results that were available during my care of the patient were reviewed by me and considered in my medical decision making (see chart for details).     Pt with new incidental finding of pregnancy and suspected cellulitic insect bite. No abscess appreciated.  She was placed on clindamycin, advised warm soaks, recheck by pcp or recheck here for any worsening sx.  Area of cellulitis marked. Prescribed prenatal vitamins.  GYN is Dr. Ralph Dowdy in [place], advised to establish prenatal care.  Return precautions discussed. Prn f/u anticipated unless abx fails to improve this infected site.  Final Clinical Impressions(s) / ED Diagnoses   Final diagnoses:  Cellulitis of buttock  Less than [redacted] weeks gestation of pregnancy    ED Discharge Orders         Ordered    clindamycin (CLEOCIN) 150 MG capsule  3 times daily     09/18/17 0054    Prenatal Vit-Fe Fumarate-FA (PRENATAL VITAMIN) 27-0.8 MG TABS  Daily     09/18/17 0054           Burgess Amor, PA-C 09/18/17 1220    Dione Booze, MD 09/19/17 [phone]

## 2017-09-18 NOTE — ED Notes (Signed)
Pt states she is late on her menstrual cycle as well and would like to get checked. Burgess AmorIdol, Julie PA Notified.

## 2017-09-18 NOTE — Discharge Instructions (Addendum)
Take the entire course of the antibiotic prescribed.  Apply a warm water soak for 10 minutes twice daily.  Get rechecked for any worsened redness as discussed (within 24 hours of starting the antibiotic this area of redness should be starting to shrink). Get rechecked for worsened pain, swelling or expanding redness as discussed.  Start the prenatal vitamin.  Call your ob/gyn to establish care for this new pregnancy.

## 2017-10-27 ENCOUNTER — Emergency Department (HOSPITAL_COMMUNITY)
Admission: EM | Admit: 2017-10-27 | Discharge: 2017-10-28 | Disposition: A | Discharge status: Home / Self Care | Payer: Medicaid Other | Attending: Emergency Medicine | Admitting: Emergency Medicine

## 2017-10-27 DIAGNOSIS — O23591 Infection of other part of genital tract in pregnancy, first trimester: Secondary | ICD-10-CM | POA: Insufficient documentation

## 2017-10-27 DIAGNOSIS — N390 Urinary tract infection, site not specified: Secondary | ICD-10-CM

## 2017-10-27 DIAGNOSIS — O26891 Other specified pregnancy related conditions, first trimester: Secondary | ICD-10-CM | POA: Diagnosis present

## 2017-10-27 DIAGNOSIS — Z3A09 9 weeks gestation of pregnancy: Secondary | ICD-10-CM | POA: Diagnosis not present

## 2017-10-27 DIAGNOSIS — Z79899 Other long term (current) drug therapy: Secondary | ICD-10-CM | POA: Insufficient documentation

## 2017-10-27 DIAGNOSIS — R102 Pelvic and perineal pain: Secondary | ICD-10-CM

## 2017-10-27 DIAGNOSIS — R319 Hematuria, unspecified: Secondary | ICD-10-CM

## 2017-10-27 DIAGNOSIS — O10011 Pre-existing essential hypertension complicating pregnancy, first trimester: Secondary | ICD-10-CM | POA: Insufficient documentation

## 2017-10-27 DIAGNOSIS — N760 Acute vaginitis: Secondary | ICD-10-CM

## 2017-10-27 DIAGNOSIS — B9689 Other specified bacterial agents as the cause of diseases classified elsewhere: Secondary | ICD-10-CM

## 2017-10-27 DIAGNOSIS — O2340 Unspecified infection of urinary tract in pregnancy, unspecified trimester: Secondary | ICD-10-CM | POA: Insufficient documentation

## 2017-10-27 DIAGNOSIS — O26899 Other specified pregnancy related conditions, unspecified trimester: Secondary | ICD-10-CM

## 2017-10-27 NOTE — ED Triage Notes (Signed)
Pt C/O suprapubic abdominal pain that started around 2200 this afternoon. Pt denies any abnormal vaginal discharge or burning with urination. Pt states she was told by her OB that "there is a leak between me and the baby."

## 2017-10-28 ENCOUNTER — Encounter (HOSPITAL_COMMUNITY): Payer: Self-pay | Admitting: Emergency Medicine

## 2017-10-28 ENCOUNTER — Emergency Department (HOSPITAL_COMMUNITY): Payer: Medicaid Other

## 2017-10-28 ENCOUNTER — Other Ambulatory Visit: Payer: Self-pay

## 2017-10-28 LAB — URINALYSIS, ROUTINE W REFLEX MICROSCOPIC
BILIRUBIN URINE: NEGATIVE
Glucose, UA: NEGATIVE mg/dL
Hgb urine dipstick: NEGATIVE
Ketones, ur: 5 mg/dL — AB
Nitrite: NEGATIVE
PH: 5 (ref 5.0–8.0)
Protein, ur: 30 mg/dL — AB
SPECIFIC GRAVITY, URINE: 1.034 — AB (ref 1.005–1.030)
WBC, UA: >50 WBC/hpf — ABNORMAL HIGH (ref 0–5)

## 2017-10-28 LAB — CBC WITH DIFFERENTIAL/PLATELET
BASOS ABS: 0.1 10*3/uL (ref 0.0–0.1)
Basophils Relative: 1 %
EOS PCT: 1 %
Eosinophils Absolute: 0.1 10*3/uL (ref 0.0–0.7)
HEMATOCRIT: 37.9 % (ref 36.0–46.0)
HEMOGLOBIN: 12.7 g/dL (ref 12.0–15.0)
LYMPHS ABS: 2.1 10*3/uL (ref 0.7–4.0)
LYMPHS PCT: 22 %
MCH: 30.8 pg (ref 26.0–34.0)
MCHC: 33.5 g/dL (ref 30.0–36.0)
MCV: 91.8 fL (ref 78.0–100.0)
Monocytes Absolute: 0.7 10*3/uL (ref 0.1–1.0)
Monocytes Relative: 7 %
NEUTROS ABS: 6.6 10*3/uL (ref 1.7–7.7)
NEUTROS PCT: 69 %
Platelets: 279 10*3/uL (ref 150–400)
RBC: 4.13 MIL/uL (ref 3.87–5.11)
RDW: 13.9 % (ref 11.5–15.5)
WBC: 9.5 10*3/uL (ref 4.0–10.5)

## 2017-10-28 LAB — BASIC METABOLIC PANEL
ANION GAP: 8 (ref 5–15)
BUN: 8 mg/dL (ref 6–20)
CALCIUM: 9 mg/dL (ref 8.9–10.3)
CHLORIDE: 105 mmol/L (ref 98–111)
CO2: 23 mmol/L (ref 22–32)
CREATININE: 0.58 mg/dL (ref 0.44–1.00)
GFR calc Af Amer: >60 mL/min (ref >60)
GLUCOSE: 107 mg/dL — AB (ref 70–99)
POTASSIUM: 3.2 mmol/L — AB (ref 3.5–5.1)
Sodium: 136 mmol/L (ref 135–145)

## 2017-10-28 LAB — WET PREP, GENITAL
SPERM: NONE SEEN
TRICH WET PREP: NONE SEEN
Yeast Wet Prep HPF POC: NONE SEEN

## 2017-10-28 LAB — ABO/RH: ABO/RH(D): O POS

## 2017-10-28 LAB — HCG, QUANTITATIVE, PREGNANCY: hCG, Beta Chain, Quant, S: 141870 m[IU]/mL — ABNORMAL HIGH (ref <5)

## 2017-10-28 MED ORDER — CEPHALEXIN 500 MG PO CAPS: 500.0000 mg | ORAL_CAPSULE | Freq: Three times a day (TID) | ORAL | 0 refills | Status: DC

## 2017-10-28 MED ORDER — CEPHALEXIN 500 MG PO CAPS
500.0000 mg | ORAL_CAPSULE | Freq: Once | ORAL | Status: AC
  Administered 2017-10-28: 500 mg via ORAL
  Filled 2017-10-28: qty 1

## 2017-10-28 MED ORDER — METRONIDAZOLE 500 MG PO TABS: 500.0000 mg | ORAL_TABLET | Freq: Two times a day (BID) | ORAL | 0 refills | Status: DC

## 2017-10-28 MED ORDER — METRONIDAZOLE 500 MG PO TABS
500.0000 mg | ORAL_TABLET | Freq: Once | ORAL | Status: AC
  Administered 2017-10-28: 500 mg via ORAL
  Filled 2017-10-28: qty 1

## 2017-10-28 NOTE — Discharge Instructions (Addendum)
Your ultrasound appears normal.  Take the antibiotics for vaginal infection as well as urinary tract infection while your urine culture is pending.  Follow-up with your gynecologist and call in the morning for an appointment.  Return to the ED if you develop new or worsening symptoms.

## 2017-10-28 NOTE — ED Notes (Signed)
Pt returned from US

## 2017-10-28 NOTE — ED Notes (Signed)
Patient transported to Ultrasound 

## 2017-10-28 NOTE — ED Provider Notes (Signed)
[facility] EMERGENCY DEPARTMENT Provider Note   CSN: [id number] Arrival date & time: 10/27/17  2330     History   Chief Complaint Chief Complaint  Patient presents with  . Abdominal Pain    [redacted] weeks pregnant    HPI Nancy Morrison is a 32 y.o. female.  Patient G3 P2 at approximately 9 to [redacted] weeks gestation presenting with lower abdominal cramping onset while she was at work [employer].  Describes pelvic cramping in her mid and left lower abdomen.  This has been ongoing since about 10 PM.  She sees OB in [place] and was told that she had an IUP on ultrasound for this pregnancy.  She states there was a "leak between me and the baby" on the ultrasound that needed to have further follow-up.  She denies any vaginal bleeding or discharge.  No fever.  No pain with urination or blood in the urine.  No vomiting or diarrhea.  States no previous issues with her previous pregnancies.  She has not had this kind of pain in the past.  She does not know whether it is similar to menstrual cramps.  The history is provided by the patient.  Abdominal Pain   Pertinent negatives include fever, nausea, vomiting, dysuria, hematuria and myalgias.    Past Medical History:  Diagnosis Date  . Hypertension     There are no active problems to display for this patient.   Past Surgical History:  Procedure Laterality Date  . LEG SURGERY     left leg     OB History    Gravida  3   Para  2   Term  2   Preterm  0   AB  0   Living  2     SAB      TAB      Ectopic      Multiple      Live Births               Home Medications    Prior to Admission medications   Medication Sig Start Date End Date Taking? Authorizing Provider  clindamycin (CLEOCIN) 150 MG capsule Take 3 capsules (450 mg total) by mouth 3 (three) times daily. 09/18/17   Burgess Amor, PA-C  levonorgestrel (MIRENA) 20 MCG/24HR IUD 1 each by Intrauterine route once.    [provider]  naproxen (NAPROSYN) 500 MG  tablet Take 1 tablet (500 mg total) by mouth 2 (two) times daily. 04/14/15   Bethann Berkshire, MD  Prenatal Vit-Fe Fumarate-FA (PRENATAL VITAMIN) 27-0.8 MG TABS Take 1 tablet by mouth daily. 09/18/17   Burgess Amor, PA-C  traMADol (ULTRAM) 50 MG tablet Take 1 tablet (50 mg total) by mouth every 6 (six) hours as needed. 04/14/15   Bethann Berkshire, MD    Family History No family history on file.  Social History Social History   Tobacco Use  . Smoking status: Never Smoker  . Smokeless tobacco: Never Used  Substance Use Topics  . Alcohol use: No  . Drug use: No     Allergies   Lactose intolerance (gi) and Penicillins   Review of Systems Review of Systems  Constitutional: Negative for activity change, appetite change and fever.  HENT: Negative for congestion.   Respiratory: Negative for cough, chest tightness and shortness of breath.   Cardiovascular: Negative for chest pain.  Gastrointestinal: Positive for abdominal pain. Negative for nausea and vomiting.  Genitourinary: Positive for pelvic pain. Negative for dysuria, flank pain,  hematuria, vaginal bleeding, vaginal discharge and vaginal pain.  Musculoskeletal: Negative for back pain and myalgias.  Neurological: Negative for dizziness, weakness and light-headedness.    all other systems are negative except as noted in the HPI and PMH.    Physical Exam Updated Vital Signs BP 125/75 (BP Location: Right Arm)   Pulse 68   Temp 98.3 F (36.8 C) (Oral)   Resp 16   LMP 08/21/2017 (Exact Date)   SpO2 99%   Physical Exam  Constitutional: She is oriented to person, place, and time. She appears well-developed and well-nourished. No distress.  HENT:  Head: Normocephalic and atraumatic.  Mouth/Throat: Oropharynx is clear and moist. No oropharyngeal exudate.  Eyes: Pupils are equal, round, and reactive to light. Conjunctivae and EOM are normal.  Neck: Normal range of motion. Neck supple.  No meningismus.  Cardiovascular: Normal rate,  regular rhythm, normal heart sounds and intact distal pulses.  No murmur heard. Pulmonary/Chest: Effort normal and breath sounds normal. No respiratory distress.  Abdominal: Soft. There is tenderness. There is no rebound and no guarding.  Minimal suprapubic tenderness and LLQ tenderness. No guarding or rebound  Genitourinary:  Genitourinary Comments: Chaperone present.  Normal external genitalia.  Cervix is closed.  No CMT.  There is midline uterine tenderness without lateralizing adnexal tenderness  Musculoskeletal: Normal range of motion. She exhibits no edema or tenderness.  No CVAT  Neurological: She is alert and oriented to person, place, and time. No cranial nerve deficit. She exhibits normal muscle tone. Coordination normal.  No ataxia on finger to nose bilaterally. No pronator drift. 5/5 strength throughout. CN 2-12 intact.Equal grip strength. Sensation intact.   Skin: Skin is warm.  Psychiatric: She has a normal mood and affect. Her behavior is normal.  Nursing note and vitals reviewed.    ED Treatments / Results  Labs (all labs ordered are listed, but only abnormal results are displayed) Labs Reviewed  WET PREP, GENITAL - Abnormal; Notable for the following components:      Result Value   Clue Cells Wet Prep HPF POC PRESENT (*)    WBC, Wet Prep HPF POC MODERATE (*)    All other components within normal limits  URINALYSIS, ROUTINE W REFLEX MICROSCOPIC - Abnormal; Notable for the following components:   Color, Urine AMBER (*)    APPearance CLOUDY (*)    Specific Gravity, Urine 1.034 (*)    Ketones, ur 5 (*)    Protein, ur 30 (*)    Leukocytes, UA SMALL (*)    WBC, UA >50 (*)    Bacteria, UA RARE (*)    All other components within normal limits  HCG, QUANTITATIVE, PREGNANCY - Abnormal; Notable for the following components:   hCG, Beta Chain, Quant, S 141,870 (*)    All other components within normal limits  BASIC METABOLIC PANEL - Abnormal; Notable for the following  components:   Potassium 3.2 (*)    Glucose, Bld 107 (*)    All other components within normal limits  URINE CULTURE  CBC WITH DIFFERENTIAL/PLATELET  ABO/RH  GC/CHLAMYDIA PROBE AMP ([place]) NOT AT [facility]    EKG None  Radiology US Ob Comp Less 14 Wks  Result Date: 10/28/2017 CLINICAL DATA:  Pregnant, pelvic pain x1 day EXAM: OBSTETRIC <14 WK [place] AND TRANSVAGINAL OB US DOPPLER ULTRASOUND OF OVARIES TECHNIQUE: Both transabdominal and transvaginal ultrasound examinations were performed for complete evaluation of the gestation as well as the maternal uterus, adnexal regions, and pelvic cul-de-sac. Transvaginal technique  was performed to assess early pregnancy. Color and duplex Doppler ultrasound was utilized to evaluate blood flow to the ovaries. COMPARISON:  None. FINDINGS: Intrauterine gestational sac: Single Yolk sac:  Visualized. Embryo:  Visualized. Cardiac Activity: Visualized. Heart Rate: 175 bpm CRL:   34.2 mm   10 w 2 d                  [place] EDC: 05/24/2018 Subchorionic hemorrhage:  Small subchronic hemorrhage. Maternal uterus/adnexae: Right ovary is not discretely visualized. Left ovary is within normal limits. No free fluid. Pulsed Doppler evaluation of the left ovary demonstrates normal appearing low-resistance arterial and venous waveforms. IMPRESSION: Single live intrauterine gestation, with estimated gestational age [redacted] weeks 2 days by crown-rump length, as above. Normal sonographic appearance of the left ovary. No evidence of left ovarian torsion. Right ovary is not discretely visualized. Electronically Signed   By: Charline Bills M.D.   On: 10/28/2017 02:06   US Ob Transvaginal  Result Date: 10/28/2017 CLINICAL DATA:  Pregnant, pelvic pain x1 day EXAM: OBSTETRIC <14 WK [place] AND TRANSVAGINAL OB US DOPPLER ULTRASOUND OF OVARIES TECHNIQUE: Both transabdominal and transvaginal ultrasound examinations were performed for complete evaluation of the gestation as well as the maternal uterus,  adnexal regions, and pelvic cul-de-sac. Transvaginal technique was performed to assess early pregnancy. Color and duplex Doppler ultrasound was utilized to evaluate blood flow to the ovaries. COMPARISON:  None. FINDINGS: Intrauterine gestational sac: Single Yolk sac:  Visualized. Embryo:  Visualized. Cardiac Activity: Visualized. Heart Rate: 175 bpm CRL:   34.2 mm   10 w 2 d                  [place] EDC: 05/24/2018 Subchorionic hemorrhage:  Small subchronic hemorrhage. Maternal uterus/adnexae: Right ovary is not discretely visualized. Left ovary is within normal limits. No free fluid. Pulsed Doppler evaluation of the left ovary demonstrates normal appearing low-resistance arterial and venous waveforms. IMPRESSION: Single live intrauterine gestation, with estimated gestational age [redacted] weeks 2 days by crown-rump length, as above. Normal sonographic appearance of the left ovary. No evidence of left ovarian torsion. Right ovary is not discretely visualized. Electronically Signed   By: Charline Bills M.D.   On: 10/28/2017 02:06   US Pelvic Doppler (torsion R/o Or Mass Arterial Flow)  Result Date: 10/28/2017 CLINICAL DATA:  Pregnant, pelvic pain x1 day EXAM: OBSTETRIC <14 WK [place] AND TRANSVAGINAL OB US DOPPLER ULTRASOUND OF OVARIES TECHNIQUE: Both transabdominal and transvaginal ultrasound examinations were performed for complete evaluation of the gestation as well as the maternal uterus, adnexal regions, and pelvic cul-de-sac. Transvaginal technique was performed to assess early pregnancy. Color and duplex Doppler ultrasound was utilized to evaluate blood flow to the ovaries. COMPARISON:  None. FINDINGS: Intrauterine gestational sac: Single Yolk sac:  Visualized. Embryo:  Visualized. Cardiac Activity: Visualized. Heart Rate: 175 bpm CRL:   34.2 mm   10 w 2 d                  [place] EDC: 05/24/2018 Subchorionic hemorrhage:  Small subchronic hemorrhage. Maternal uterus/adnexae: Right ovary is not discretely visualized. Left  ovary is within normal limits. No free fluid. Pulsed Doppler evaluation of the left ovary demonstrates normal appearing low-resistance arterial and venous waveforms. IMPRESSION: Single live intrauterine gestation, with estimated gestational age [redacted] weeks 2 days by crown-rump length, as above. Normal sonographic appearance of the left ovary. No evidence of left ovarian torsion. Right ovary is not discretely visualized. Electronically Signed  By: Charline Bills M.D.   On: 10/28/2017 02:06    Procedures Procedures (including critical care time)  Medications Ordered in ED Medications - No data to display   Initial Impression / Assessment and Plan / ED Course  I have reviewed the triage vital signs and the nursing notes.  Pertinent labs & imaging results that were available during my care of the patient were reviewed by me and considered in my medical decision making (see chart for details).    [redacted] weeks pregnant with pelvic pain.  No bleeding.  Outside ultrasound confirmed IUP.  No peritoneal signs on exam and vitals stable. Urinalysis contaminated but does show pyuria and leukocytes.  We will send culture.  Pelvic exam as per above.  Will treat bacterial vaginosis as well as possible UTI. Ultrasound shows IUP without complicating features.  Normal blood flow to left ovary.  Discussed with patient follow-up with her OB.  Use Tylenol as needed for pain.  Take antibiotics for UTI and bacterial vaginosis.  Return precautions discussed.  Final Clinical Impressions(s) / ED Diagnoses   Final diagnoses:  Pelvic pain affecting pregnancy  Bacterial vaginosis  Urinary tract infection with hematuria, site unspecified    ED Discharge Orders    None       Gabryela Kimbrell, Jeannett Senior, MD 10/28/17 [phone]

## 2017-10-29 LAB — GC/CHLAMYDIA PROBE AMP (~~LOC~~) NOT AT ARMC
CHLAMYDIA, DNA PROBE: NEGATIVE
Neisseria Gonorrhea: NEGATIVE

## 2017-10-30 ENCOUNTER — Telehealth (HOSPITAL_BASED_OUTPATIENT_CLINIC_OR_DEPARTMENT_OTHER): Payer: Self-pay | Admitting: Emergency Medicine

## 2017-10-30 LAB — URINE CULTURE: Culture: 50000 — AB

## 2017-10-30 NOTE — Telephone Encounter (Signed)
Per Dr. Dalene Seltzer patient is currently on appropriate therapy for group beta strep.  Currently taking keflex.

## 2017-10-30 NOTE — Telephone Encounter (Signed)
Attempted to call patient and notify her of results.  No answer. Left voicemail with no identifying information for patient to return phone call to ED.

## 2017-10-30 NOTE — ED Provider Notes (Signed)
Urine cx positive for Group B Strep. Patient is on keflex, appropriate therapy.     Alvira Monday, MD 10/30/17 1550

## 2017-10-31 ENCOUNTER — Telehealth: Payer: Self-pay

## 2017-10-31 NOTE — Telephone Encounter (Signed)
Post ED Visit - Positive Culture Follow-up  Culture report reviewed by antimicrobial stewardship pharmacist:  []  Enzo Bi, Pharm.D. []  Celedonio Miyamoto, Pharm.D., BCPS AQ-ID []  Garvin Fila, Pharm.D., BCPS []  Georgina Pillion, Pharm.D., BCPS []  [place], [address].D., BCPS, AAHIVP []  Estella Husk, Pharm.D., BCPS, AAHIVP []  Lysle Pearl, PharmD, BCPS []  Phillips Climes, PharmD, BCPS [x]  Agapito Games, PharmD, BCPS []  Verlan Friends, PharmD  Positive urine culture Treated with Cephalexin, organism sensitive to the same and no further patient follow-up is required at this time.  Jerry Caras 10/31/2017, 10:16 AM

## 2017-11-10 ENCOUNTER — Other Ambulatory Visit: Payer: Self-pay

## 2017-11-10 ENCOUNTER — Emergency Department (HOSPITAL_COMMUNITY)
Admission: EM | Admit: 2017-11-10 | Discharge: 2017-11-11 | Disposition: A | Discharge status: Home / Self Care | Payer: Medicaid Other | Attending: Emergency Medicine | Admitting: Emergency Medicine

## 2017-11-10 ENCOUNTER — Encounter (HOSPITAL_COMMUNITY): Payer: Self-pay | Admitting: Emergency Medicine

## 2017-11-10 DIAGNOSIS — Z79899 Other long term (current) drug therapy: Secondary | ICD-10-CM | POA: Insufficient documentation

## 2017-11-10 DIAGNOSIS — R42 Dizziness and giddiness: Secondary | ICD-10-CM | POA: Insufficient documentation

## 2017-11-10 DIAGNOSIS — I10 Essential (primary) hypertension: Secondary | ICD-10-CM | POA: Insufficient documentation

## 2017-11-10 DIAGNOSIS — R030 Elevated blood-pressure reading, without diagnosis of hypertension: Secondary | ICD-10-CM

## 2017-11-10 LAB — CBC WITH DIFFERENTIAL/PLATELET
Abs Immature Granulocytes: 0.06 10*3/uL (ref 0.00–0.07)
BASOS ABS: 0.1 10*3/uL (ref 0.0–0.1)
Basophils Relative: 1 %
EOS ABS: 0 10*3/uL (ref 0.0–0.5)
Eosinophils Relative: 0 %
HEMATOCRIT: 38.3 % (ref 36.0–46.0)
Hemoglobin: 12.3 g/dL (ref 12.0–15.0)
IMMATURE GRANULOCYTES: 1 %
Lymphocytes Relative: 19 %
Lymphs Abs: 1.8 10*3/uL (ref 0.7–4.0)
MCH: 29.4 pg (ref 26.0–34.0)
MCHC: 32.1 g/dL (ref 30.0–36.0)
MCV: 91.4 fL (ref 80.0–100.0)
MONOS PCT: 7 %
Monocytes Absolute: 0.7 10*3/uL (ref 0.1–1.0)
NEUTROS ABS: 6.9 10*3/uL (ref 1.7–7.7)
NEUTROS PCT: 72 %
NRBC: 0 % (ref 0.0–0.2)
Platelets: 304 10*3/uL (ref 150–400)
RBC: 4.19 MIL/uL (ref 3.87–5.11)
RDW: 13.2 % (ref 11.5–15.5)
WBC: 9.6 10*3/uL (ref 4.0–10.5)

## 2017-11-10 LAB — URINALYSIS, ROUTINE W REFLEX MICROSCOPIC
BACTERIA UA: NONE SEEN
BILIRUBIN URINE: NEGATIVE
Glucose, UA: NEGATIVE mg/dL
KETONES UR: 5 mg/dL — AB
NITRITE: NEGATIVE
PROTEIN: 30 mg/dL — AB
Specific Gravity, Urine: 1.031 — ABNORMAL HIGH (ref 1.005–1.030)
pH: 5 (ref 5.0–8.0)

## 2017-11-10 LAB — BASIC METABOLIC PANEL
ANION GAP: 9 (ref 5–15)
BUN: 7 mg/dL (ref 6–20)
CALCIUM: 9.1 mg/dL (ref 8.9–10.3)
CHLORIDE: 105 mmol/L (ref 98–111)
CO2: 20 mmol/L — AB (ref 22–32)
Creatinine, Ser: 0.59 mg/dL (ref 0.44–1.00)
GFR calc non Af Amer: >60 mL/min (ref >60)
GLUCOSE: 112 mg/dL — AB (ref 70–99)
Potassium: 3.2 mmol/L — ABNORMAL LOW (ref 3.5–5.1)
Sodium: 134 mmol/L — ABNORMAL LOW (ref 135–145)

## 2017-11-10 LAB — CBG MONITORING, ED: GLUCOSE-CAPILLARY: 91 mg/dL (ref 70–99)

## 2017-11-10 NOTE — ED Provider Notes (Signed)
[facility] EMERGENCY DEPARTMENT Provider Note   CSN: [id number] Arrival date & time: 11/10/17  1907    History   Chief Complaint Chief Complaint  Patient presents with  . Hypoglycemia  . Hypertension    HPI Nancy Morrison is a 32 y.o. female.   32 y/o G3P2 female, currently [redacted]w[redacted]d gestation, presents to the ED for c/o lightheadedness.  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  She states that she has had similar episodes of lightheadedness with hypertension in the past.  Reports a mild tightness in the center of her chest.  Symptoms began at 1720.  She was able to have her sugar checked at 1800.  Was told it was "low" so she drank a Pepsi and ate some yogurt.  Also reports being told that her blood pressure was high.  She states that she is feeling much better.  Denies any known history of preeclampsia, but states that she does have a history of hypertension during pregnancy, often requiring BP medications.  Denies fevers, vomiting, abdominal pain, vaginal bleeding, vaginal discharge, dysuria.     Past Medical History:  Diagnosis Date  . Hypertension     There are no active problems to display for this patient.   Past Surgical History:  Procedure Laterality Date  . LEG SURGERY     left leg     OB History    Gravida  3   Para  2   Term  2   Preterm  0   AB  0   Living  2     SAB      TAB      Ectopic      Multiple      Live Births               Home Medications    Prior to Admission medications   Medication Sig Start Date End Date Taking? Authorizing Provider  cephALEXin (KEFLEX) 500 MG capsule Take 1 capsule (500 mg total) by mouth 3 (three) times daily. 10/28/17  Yes Rancour, Jeannett Senior, MD  metroNIDAZOLE (FLAGYL) 500 MG tablet Take 1 tablet (500 mg total) by mouth 2 (two) times daily. 10/28/17  Yes Rancour, Jeannett Senior, MD  Prenatal Vit-Fe Fumarate-FA (PRENATAL VITAMIN)  27-0.8 MG TABS Take 1 tablet by mouth daily. 09/18/17  Yes Idol, Raynelle Fanning, PA-C  clindamycin (CLEOCIN) 150 MG capsule Take 3 capsules (450 mg total) by mouth 3 (three) times daily. Patient not taking: Reported on 11/10/2017 09/18/17   Burgess Amor, PA-C  naproxen (NAPROSYN) 500 MG tablet Take 1 tablet (500 mg total) by mouth 2 (two) times daily. Patient not taking: Reported on 11/10/2017 04/14/15   Bethann Berkshire, MD  traMADol (ULTRAM) 50 MG tablet Take 1 tablet (50 mg total) by mouth every 6 (six) hours as needed. Patient not taking: Reported on 11/10/2017 04/14/15   Bethann Berkshire, MD    Family History History reviewed. No pertinent family history.  Social History Social History   Tobacco Use  . Smoking status: Never Smoker  . Smokeless tobacco: Never Used  Substance Use Topics  . Alcohol use: No  . Drug use: No     Allergies   Lactose intolerance (gi) and Penicillins   Review of Systems Review of Systems Ten systems reviewed and are negative for acute change, except as noted in the HPI.    Physical Exam Updated Vital Signs BP 135/85 (BP Location:  Right Arm)   Pulse 80   Temp 98.7 F (37.1 C) (Oral)   Resp 16   Ht 5\' 8"  (1.727 m)   Wt 107 kg   LMP 08/21/2017 (Exact Date)   SpO2 100%   BMI 35.88 kg/m   Physical Exam  Constitutional: She is oriented to person, place, and time. She appears well-developed and well-nourished. No distress.  Nontoxic appearing and in NAD  HENT:  Head: Normocephalic and atraumatic.  Eyes: Conjunctivae and EOM are normal. No scleral icterus.  Neck: Normal range of motion.  Cardiovascular: Normal rate, regular rhythm and intact distal pulses.  Pulmonary/Chest: Effort normal. No stridor. No respiratory distress. She has no wheezes.  Respirations even and unlabored. Lungs CTAB.  Abdominal: Soft. She exhibits no distension and no mass. There is no tenderness. There is no guarding.  Soft, nontender abdomen.  Musculoskeletal: Normal range of  motion.  Neurological: She is alert and oriented to person, place, and time. She exhibits normal muscle tone. Coordination normal.  Skin: Skin is warm and dry. No rash noted. She is not diaphoretic. No erythema. No pallor.  Psychiatric: She has a normal mood and affect. Her behavior is normal.  Nursing note and vitals reviewed.    ED Treatments / Results  Labs (all labs ordered are listed, but only abnormal results are displayed) Labs Reviewed  BASIC METABOLIC PANEL - Abnormal; Notable for the following components:      Result Value   Sodium 134 (*)    Potassium 3.2 (*)    CO2 20 (*)    Glucose, Bld 112 (*)    All other components within normal limits  URINALYSIS, ROUTINE W REFLEX MICROSCOPIC - Abnormal; Notable for the following components:   Color, Urine AMBER (*)    APPearance CLOUDY (*)    Specific Gravity, Urine 1.031 (*)    Hgb urine dipstick SMALL (*)    Ketones, ur 5 (*)    Protein, ur 30 (*)    Leukocytes, UA TRACE (*)    Squamous Epithelial / LPF >50 (*)    All other components within normal limits  CBG MONITORING, ED - Abnormal; Notable for the following components:   Glucose-Capillary 66 (*)    All other components within normal limits  CBG MONITORING, ED - Abnormal; Notable for the following components:   Glucose-Capillary 126 (*)    All other components within normal limits  CBC WITH DIFFERENTIAL/PLATELET  CBG MONITORING, ED    EKG None  Radiology No results found.  Procedures Procedures (including critical care time)  Medications Ordered in ED Medications - No data to display   Initial Impression / Assessment and Plan / ED Course  I have reviewed the triage vital signs and the nursing notes.  Pertinent labs & imaging results that were available during my care of the patient were reviewed by me and considered in my medical decision making (see chart for details).     32 year old female resents to the emergency department for evaluation of  lightheadedness.  Was reportedly hypertensive prior to arrival as well as hypoglycemic.  She had Pepsi as well as a yogurt prior to transport.  Has eaten a sandwich and had juice to drink while in the ED.  Repeat CBG after oral intake was 126.  She states that she has had similar episodes of lightheadedness in the setting of hypertension.  Notes prior gestational hypertension requiring treatment with medications.  She has had some recorded high blood pressure readings since arrival,  but blood pressure has also normalized without intervention.  She is currently [redacted] weeks pregnant, not within the window for concern for preeclampsia.  She denies any history of preeclampsia.  Currently denying any chest pain or shortness of breath.  No syncope prior to arrival.  No recent fevers.  Remainder laboratory work-up has been generally reassuring.  Patient is scheduled to follow-up with her OB/GYN tomorrow.  I have advised that she keep this appointment and discuss her symptoms with her doctor.  I do not believe further emergent work-up is indicated at this time.  Return precautions discussed and provided. Patient discharged in stable condition with no unaddressed concerns.  Vitals:   11/10/17 2314 11/10/17 2330 11/11/17 0030 11/11/17 0154  BP: 134/80 (!) 115/103 132/90 135/85  Pulse:  70 86 80  Resp: 16  16 16   Temp:      TempSrc:      SpO2: 100% 100% 100% 100%  Weight:      Height:        Final Clinical Impressions(s) / ED Diagnoses   Final diagnoses:  Elevated blood pressure reading  Lightheadedness    ED Discharge Orders    None       Antony Madura, PA-C 11/11/17 0981    Little, Ambrose Finland, MD 11/11/17 1431

## 2017-11-10 NOTE — ED Triage Notes (Signed)
Pt states that she was at work for 4 hours at began feeling dizzy and lightheaded she states that the security guards at her work took her blood sugar and blood pressure and told her her blood sugar was low and that her blood pressure was high. She does not know what her blood sugar or BP was. Pt reports that she ate and drank something and is feeling a lot better. Pt also reports that she is 3 months pregnant.

## 2017-11-10 NOTE — ED Provider Notes (Signed)
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 Reports associated chest tightness. Pt states had to wait to be checked out, states that she was told that she had to continue to work for several hours. When they checked her blood sugar it was "low" and bp was 200 systolic. States she snacked and now feels better. Denies any symptoms now. No abd pain, no vaginal bleeding.  Reports hx of gestation htn.   ROS: dizziness, lightheadiness  Physical Exam:   Gen: No distress  Neuro: Awake and Alert  Skin: Warm    Focused Exam: Lungs clear to ausculation, regular HR and rhythm.   Pt in NAD, asymptomatic. Will check basic labs, including H&H and electrolytes and get urine.  Blood pressure normal here.  No pregnancy related complaints. Vitals:   11/10/17 1929 11/10/17 1937  BP: (!) 127/94   Pulse: 78   Resp: 18   Temp: 98.9 F (37.2 C)   TempSrc: Oral   SpO2: 98%   Weight:  107 kg  Height:  5\' 8"  (1.727 m)      Initiation of care has begun. The patient has been counseled on the process, plan, and necessity for staying for the completion/evaluation, and the remainder of the medical screening examination    Jaynie Crumble, PA-C 11/10/17 1942    Little, Ambrose Finland, MD 11/11/17 1424

## 2017-11-11 LAB — CBG MONITORING, ED
Glucose-Capillary: 126 mg/dL — ABNORMAL HIGH (ref 70–99)
Glucose-Capillary: 66 mg/dL — ABNORMAL LOW (ref 70–99)

## 2017-11-11 NOTE — ED Notes (Signed)
Patient verbalizes understanding of discharge instructions. Opportunity for questioning and answers were provided. Armband removed by staff, pt discharged from ED, ambulatory to lobby.  

## 2017-11-11 NOTE — ED Notes (Signed)
Patient given juice and turkey sandwich.

## 2017-11-11 NOTE — Discharge Instructions (Addendum)
Be sure to eat regular meals and to drink plenty of fluids to prevent dehydration.  You were found to have some higher blood pressure readings while in the emergency department.  Given that you have a history of gestational hypertension, we recommend that you follow-up with your OB/GYN and discuss your blood pressure with your doctor.  You may need to restart medication for blood pressure control.  You may return to the ED for any new or concerning symptoms.

## 2018-02-16 ENCOUNTER — Encounter (HOSPITAL_COMMUNITY): Payer: Self-pay

## 2018-02-16 ENCOUNTER — Inpatient Hospital Stay (HOSPITAL_COMMUNITY)
Admission: AD | Admit: 2018-02-16 | Discharge: 2018-02-17 | Disposition: A | Discharge status: Home / Self Care | Payer: Medicaid Other | Attending: Family Medicine | Admitting: Family Medicine

## 2018-02-16 DIAGNOSIS — J069 Acute upper respiratory infection, unspecified: Secondary | ICD-10-CM | POA: Insufficient documentation

## 2018-02-16 DIAGNOSIS — R51 Headache: Secondary | ICD-10-CM | POA: Diagnosis present

## 2018-02-16 DIAGNOSIS — Z3A25 25 weeks gestation of pregnancy: Secondary | ICD-10-CM | POA: Insufficient documentation

## 2018-02-16 DIAGNOSIS — O99512 Diseases of the respiratory system complicating pregnancy, second trimester: Secondary | ICD-10-CM | POA: Diagnosis not present

## 2018-02-16 DIAGNOSIS — O26892 Other specified pregnancy related conditions, second trimester: Secondary | ICD-10-CM | POA: Diagnosis not present

## 2018-02-16 NOTE — MAU Note (Signed)
Pt has had headache and cough, now left ear and throat are hurting.  Denies fever, nausea or vomiting.  Denies LOF/Bleeding. +FM

## 2018-02-17 ENCOUNTER — Telehealth: Payer: Self-pay | Admitting: Certified Nurse Midwife

## 2018-02-17 DIAGNOSIS — J069 Acute upper respiratory infection, unspecified: Secondary | ICD-10-CM

## 2018-02-17 DIAGNOSIS — O26892 Other specified pregnancy related conditions, second trimester: Secondary | ICD-10-CM

## 2018-02-17 DIAGNOSIS — Z3A25 25 weeks gestation of pregnancy: Secondary | ICD-10-CM

## 2018-02-17 LAB — URINALYSIS, ROUTINE W REFLEX MICROSCOPIC
Bilirubin Urine: NEGATIVE
GLUCOSE, UA: NEGATIVE mg/dL
HGB URINE DIPSTICK: NEGATIVE
Ketones, ur: 5 mg/dL — AB
NITRITE: NEGATIVE
PROTEIN: 30 mg/dL — AB
SPECIFIC GRAVITY, URINE: 1.03 (ref 1.005–1.030)
Squamous Epithelial / LPF: >50 — ABNORMAL HIGH (ref 0–5)
pH: 5 (ref 5.0–8.0)

## 2018-02-17 LAB — GROUP A STREP BY PCR: Group A Strep by PCR: NOT DETECTED

## 2018-02-17 LAB — INFLUENZA PANEL BY PCR (TYPE A & B)
INFLBPCR: NEGATIVE
Influenza A By PCR: POSITIVE — AB

## 2018-02-17 MED ORDER — OSELTAMIVIR PHOSPHATE 75 MG PO CAPS: 75.0000 mg | ORAL_CAPSULE | Freq: Two times a day (BID) | ORAL | 0 refills | Status: DC

## 2018-02-17 NOTE — MAU Provider Note (Signed)
History     CSN: [id number]  Arrival date and time: 02/16/18 2326   Chief Complaint  Patient presents with  . Headache  . Otalgia   [id number] @25 .5 wks presenting with cough, sore throat, ear pain, and HA. Sx started earlier today. Cough is non-productive. No fevers or chills. No body aches. Took Tylenol for HA and helped some. Was exposed to family member with flu last week. Feeling good FM. No pregnancy complaints.  OB History    Gravida  3   Para  2   Term  2   Preterm  0   AB  0   Living  2     SAB      TAB      Ectopic      Multiple      Live Births              Past Medical History:  Diagnosis Date  . Hypertension     Past Surgical History:  Procedure Laterality Date  . LEG SURGERY     left leg    No family history on file.  Social History   Tobacco Use  . Smoking status: Never Smoker  . Smokeless tobacco: Never Used  Substance Use Topics  . Alcohol use: No  . Drug use: No    Allergies:  PCN-hives Lactose intolerance  Medications Prior to Admission  Medication Sig Dispense Refill Last Dose  . cephALEXin (KEFLEX) 500 MG capsule Take 1 capsule (500 mg total) by mouth 3 (three) times daily. 30 capsule 0 11/10/2017 at Unknown time  . clindamycin (CLEOCIN) 150 MG capsule Take 3 capsules (450 mg total) by mouth 3 (three) times daily. (Patient not taking: Reported on 11/10/2017) 63 capsule 0 Completed Course at Unknown time  . metroNIDAZOLE (FLAGYL) 500 MG tablet Take 1 tablet (500 mg total) by mouth 2 (two) times daily. 14 tablet 0 11/10/2017 at Unknown time  . naproxen (NAPROSYN) 500 MG tablet Take 1 tablet (500 mg total) by mouth 2 (two) times daily. (Patient not taking: Reported on 11/10/2017) 30 tablet 0 Completed Course at Unknown time  . Prenatal Vit-Fe Fumarate-FA (PRENATAL VITAMIN) 27-0.8 MG TABS Take 1 tablet by mouth daily. 30 tablet 0 Unknown at Unknown time  . traMADol (ULTRAM) 50 MG tablet Take 1 tablet (50 mg total) by mouth  every 6 (six) hours as needed. (Patient not taking: Reported on 11/10/2017) 15 tablet 0 Completed Course at Unknown time    Review of Systems  Constitutional: Negative for chills and fever.  HENT: Positive for ear pain, rhinorrhea, sinus pressure and sore throat. Negative for congestion.   Respiratory: Positive for cough. Negative for shortness of breath and wheezing.   Musculoskeletal: Negative for myalgias.   Physical Exam   Blood pressure 120/63, pulse 92, temperature 98.3 F (36.8 C), temperature source Oral, resp. rate 18, height 5\' 8"  (1.727 m), weight 104.4 kg, last menstrual period 08/21/2017, SpO2 96 %.  Physical Exam  Nursing note and vitals reviewed. Constitutional: She is oriented to person, place, and time. She appears well-developed and well-nourished. No distress.  HENT:  Head: Normocephalic and atraumatic.  Right Ear: Hearing, tympanic membrane, external ear and ear canal normal.  Left Ear: Hearing, tympanic membrane, external ear and ear canal normal.  Nose: Right sinus exhibits maxillary sinus tenderness. Right sinus exhibits no frontal sinus tenderness. Left sinus exhibits maxillary sinus tenderness. Left sinus exhibits no frontal sinus tenderness.  Mouth/Throat: Uvula is midline, oropharynx is clear and  moist and mucous membranes are normal.  Neck: Neck supple.  Cardiovascular: Normal rate, regular rhythm and normal heart sounds.  Respiratory: Effort normal and breath sounds normal. No respiratory distress. She has no wheezes. She has no rales.  Musculoskeletal: Normal range of motion.  Neurological: She is alert and oriented to person, place, and time. No cranial nerve deficit.  Skin: Skin is warm and dry.  Psychiatric: She has a normal mood and affect.  FHT 150  No results found for this or any previous visit (from the past 24 hour(s)).  MAU Course  Procedures Orders Placed This Encounter  Procedures  . Group A Strep by PCR    Standing Status:   Standing     Number of Occurrences:   1  . Influenza panel by PCR (type A & B)    Standing Status:   Standing    Number of Occurrences:   1  . Urinalysis, Routine w reflex microscopic    Standing Status:   Standing    Number of Occurrences:   1  . Droplet precaution    Standing Status:   Standing    Number of Occurrences:   1  . Discharge patient    Order Specific Question:   Discharge disposition    Answer:   01-Home or Self Care [1]    Order Specific Question:   Discharge patient date    Answer:   02/17/2018   MDM Labs ordered. Flu and strep pending. Most likely URI-viral. Discussed supportive measures. Stable for discharge home.  Assessment and Plan   1. Upper respiratory virus   2. [redacted] weeks gestation of pregnancy    Discharge home Follow up at [facility] in [place] as scheduled Tylenol prn OTCs- list provided   Donette Larry, CNM 02/17/2018, 12:23 AM

## 2018-02-17 NOTE — Discharge Instructions (Signed)

## 2018-02-17 NOTE — Telephone Encounter (Signed)
+  flu, pt notified. Rx sent.

## 2018-02-19 LAB — CULTURE, OB URINE: Culture: >=100000 — AB

## 2018-02-20 ENCOUNTER — Telehealth: Payer: Self-pay | Admitting: Certified Nurse Midwife

## 2018-02-20 DIAGNOSIS — O2342 Unspecified infection of urinary tract in pregnancy, second trimester: Secondary | ICD-10-CM

## 2018-02-20 MED ORDER — NITROFURANTOIN MONOHYD MACRO 100 MG PO CAPS: 100.0000 mg | ORAL_CAPSULE | Freq: Two times a day (BID) | ORAL | 0 refills | Status: DC

## 2018-02-20 NOTE — Telephone Encounter (Signed)
+  UTI, pt notified, Rx for Macrobid, push po fluids.

## 2018-02-20 NOTE — Telephone Encounter (Signed)
+  UTI, LMTCB

## 2018-03-06 ENCOUNTER — Emergency Department (HOSPITAL_COMMUNITY)
Admission: EM | Admit: 2018-03-06 | Discharge: 2018-03-07 | Disposition: A | Discharge status: Home / Self Care | Payer: Medicaid Other | Attending: Emergency Medicine | Admitting: Emergency Medicine

## 2018-03-06 ENCOUNTER — Other Ambulatory Visit: Payer: Self-pay

## 2018-03-06 ENCOUNTER — Encounter (HOSPITAL_COMMUNITY): Payer: Self-pay

## 2018-03-06 DIAGNOSIS — M62830 Muscle spasm of back: Secondary | ICD-10-CM | POA: Insufficient documentation

## 2018-03-06 DIAGNOSIS — I10 Essential (primary) hypertension: Secondary | ICD-10-CM | POA: Diagnosis not present

## 2018-03-06 DIAGNOSIS — O26893 Other specified pregnancy related conditions, third trimester: Secondary | ICD-10-CM | POA: Diagnosis present

## 2018-03-06 DIAGNOSIS — Z3A28 28 weeks gestation of pregnancy: Secondary | ICD-10-CM | POA: Insufficient documentation

## 2018-03-06 DIAGNOSIS — M545 Low back pain: Secondary | ICD-10-CM | POA: Diagnosis not present

## 2018-03-06 DIAGNOSIS — Z79899 Other long term (current) drug therapy: Secondary | ICD-10-CM | POA: Diagnosis not present

## 2018-03-06 LAB — URINALYSIS, ROUTINE W REFLEX MICROSCOPIC
Bilirubin Urine: NEGATIVE
GLUCOSE, UA: NEGATIVE mg/dL
Hgb urine dipstick: NEGATIVE
Ketones, ur: NEGATIVE mg/dL
NITRITE: NEGATIVE
PH: 6.5 (ref 5.0–8.0)
PROTEIN: NEGATIVE mg/dL
SPECIFIC GRAVITY, URINE: 1.01 (ref 1.005–1.030)

## 2018-03-06 LAB — URINALYSIS, MICROSCOPIC (REFLEX)

## 2018-03-06 MED ORDER — SODIUM CHLORIDE 0.9 % IV BOLUS
1000.0000 mL | Freq: Once | INTRAVENOUS | Status: AC
  Administered 2018-03-06: 1000 mL via INTRAVENOUS

## 2018-03-06 NOTE — ED Notes (Signed)
ED Provider at bedside. 

## 2018-03-06 NOTE — ED Notes (Signed)
[facility] NOTIFIED JEFFREY, RN @ [facility] OF PT HAVING UI AND UC'S; THIS ALONG WITH HER RISK FACTORS AND OTHER SYMPTOMS, ED PROVIDER NEEDS TO CONTACT OUR OB ATTENDING TO DISCUSS PLAN OF CARE @ [phone]

## 2018-03-06 NOTE — ED Triage Notes (Addendum)
Pt presents to ED, 28.[redacted] weeks pregnant, G3P2 with history of term pregnancies, with complaints of lower back pain which comes and goes since last night. Pt states she has had mucous discharge since this morning/afternoon and unsure if it is fluid leaking or not. Pt denies any vaginal bleeding. Pt states + Fetal movement. History of Marginal cord insertion with this pregnancy.

## 2018-03-06 NOTE — ED Provider Notes (Signed)
[facility] EMERGENCY DEPARTMENT Provider Note   CSN: [id number] Arrival date & time: 03/06/18  2051     History   Chief Complaint Chief Complaint  Patient presents with  . Back Pain    HPI Nancy Morrison is a 33 y.o. female.  HPI Patient presents with episodic low back pain which started yesterday in the evening while at work.  Unsure how often the the episodes were occurring.  No radiation of the pain.  No focal weakness or numbness in the legs.  No urinary symptoms including incontinence or dysuria.  Patient is currently denying low back pain.  Patient is [redacted] weeks pregnant.  No complications with this pregnancy thus far.  Denies vaginal bleeding or loss of fluids.  Continues to have active fetal movement. Past Medical History:  Diagnosis Date  . Hypertension     There are no active problems to display for this patient.   Past Surgical History:  Procedure Laterality Date  . LEG SURGERY     left leg     OB History    Gravida  3   Para  2   Term  2   Preterm  0   AB  0   Living  2     SAB      TAB      Ectopic      Multiple      Live Births               Home Medications    Prior to Admission medications   Medication Sig Start Date End Date Taking? Authorizing Provider  ferrous sulfate 325 (65 FE) MG tablet Take 325 mg by mouth daily with breakfast.   Yes [provider]  Prenatal Vit-Fe Fumarate-FA (PRENATAL VITAMIN) 27-0.8 MG TABS Take 1 tablet by mouth daily. 09/18/17  Yes Idol, Raynelle FanningJulie, PA-C  nitrofurantoin, macrocrystal-monohydrate, (MACROBID) 100 MG capsule Take 1 capsule (100 mg total) by mouth 2 (two) times daily. 02/20/18   Donette LarryBhambri, Melanie, CNM  oseltamivir (TAMIFLU) 75 MG capsule Take 1 capsule (75 mg total) by mouth 2 (two) times daily. 02/17/18   Donette LarryBhambri, Melanie, CNM    Family History No family history on file.  Social History Social History   Tobacco Use  . Smoking status: Never Smoker  . Smokeless tobacco: Never  Used  Substance Use Topics  . Alcohol use: No  . Drug use: No     Allergies   Lactose intolerance (gi) and Penicillins   Review of Systems Review of Systems  Constitutional: Negative for chills and fever.  Respiratory: Negative for cough and shortness of breath.   Cardiovascular: Negative for chest pain.  Gastrointestinal: Negative for abdominal pain, constipation, diarrhea, nausea and vomiting.  Genitourinary: Negative for dysuria, flank pain, frequency, hematuria, pelvic pain, vaginal bleeding and vaginal discharge.  Musculoskeletal: Positive for back pain and myalgias.  Skin: Negative for rash and wound.  Neurological: Negative for dizziness, weakness, light-headedness, numbness and headaches.  All other systems reviewed and are negative.    Physical Exam Updated Vital Signs BP 121/64 (BP Location: Left Arm)   Pulse 79   Temp (!) 97.5 F (36.4 C) (Oral)   Resp 18   Ht 5\' 8"  (1.727 m)   Wt 103.4 kg   LMP 08/21/2017 (Exact Date)   SpO2 100%   BMI 34.67 kg/m   Physical Exam Vitals signs and nursing note reviewed.  Constitutional:      Appearance: Normal appearance. She is well-developed.  HENT:     Head: Normocephalic and atraumatic.  Eyes:     Extraocular Movements: Extraocular movements intact.     Pupils: Pupils are equal, round, and reactive to light.  Neck:     Musculoskeletal: Normal range of motion and neck supple.  Cardiovascular:     Rate and Rhythm: Normal rate and regular rhythm.     Heart sounds: No murmur. No friction rub. No gallop.   Pulmonary:     Effort: Pulmonary effort is normal. No respiratory distress.     Breath sounds: Normal breath sounds. No stridor. No wheezing, rhonchi or rales.  Chest:     Chest wall: No tenderness.  Abdominal:     General: Bowel sounds are normal.     Palpations: Abdomen is soft.     Tenderness: There is no abdominal tenderness. There is no guarding or rebound.     Comments: Gravid abdomen with fundus above  the umbilicus.  Nontender to palpation.  Musculoskeletal: Normal range of motion.        General: No swelling, tenderness, deformity or signs of injury.     Right lower leg: No edema.     Left lower leg: No edema.     Comments: Definite midline midline thoracic or lumbar tenderness.  No CVA tenderness.  No lower extremity swelling, asymmetry or tenderness.  Distal pulses are intact.  Skin:    General: Skin is warm and dry.     Findings: No erythema or rash.  Neurological:     General: No focal deficit present.     Mental Status: She is alert and oriented to person, place, and time.     Comments: 5/5 motor in all extremities.  Sensation fully intact.  No saddle anesthesia.  Psychiatric:        Mood and Affect: Mood normal.        Behavior: Behavior normal.      ED Treatments / Results  Labs (all labs ordered are listed, but only abnormal results are displayed) Labs Reviewed  URINALYSIS, ROUTINE W REFLEX MICROSCOPIC - Abnormal; Notable for the following components:      Result Value   Leukocytes, UA SMALL (*)    All other components within normal limits  URINALYSIS, MICROSCOPIC (REFLEX) - Abnormal; Notable for the following components:   Bacteria, UA RARE (*)    All other components within normal limits    EKG None  Radiology No results found.  Procedures Procedures (including critical care time)  Medications Ordered in ED Medications  sodium chloride 0.9 % bolus 1,000 mL (0 mLs Intravenous Stopped 03/06/18 2244)     Initial Impression / Assessment and Plan / ED Course  I have reviewed the triage vital signs and the nursing notes.  Pertinent labs & imaging results that were available during my care of the patient were reviewed by me and considered in my medical decision making (see chart for details).    Fetal heart tones are within normal limits.  Urine without evidence of infection.  Patient is comfortable appearing.  Discussed with OB/GYN on-call.  Advised to do  cervical check.  If close skin follow-up with her OB/GYN.  Sterile glove exam with cervix being closed, thick and high.  Understands the need to follow-up closely with her OB/GYN and strict return precautions have been given.   Final Clinical Impressions(s) / ED Diagnoses   Final diagnoses:  Muscle spasm of back    ED Discharge Orders    None  Loren Racer, MD 03/06/18 [phone]

## 2018-03-06 NOTE — Discharge Instructions (Addendum)
Take Tylenol for your low back pain.  May also use heating pad.  Follow-up closely with your OB/GYN.

## 2018-03-06 NOTE — ED Notes (Signed)
[facility] NOTIFIED OF G3P2 @ 28 1/7 WHO RECEIVES HER PRENATAL CARE AT [facility] WITH C/O BACK PAIN THAT COMES AND GOES SINCE LAST NIGHT; EVERY 15-20MIN WHEN PT IS ACTIVE AND EVERY 30MIN WHEN RESTING; POSITIVE FETAL MOVEMENT, NO VAGINAL BLEEDING OR LEAKING OF FLUID; SOME MUCOUS-LIKE DISCHARGE THAT IS BROWNISH; NO INTERCOURSE IN A WEEK; REPORTS MARGINAL? CORD INSERTION, SHE FAILED HER 1HR GTT AND IS SCHEDULED TO COME BACK Tuesday 03/09/18 FOR 3HR GTT; LAST 2 PREGNANCIES SHE HAD HIGH BP'S AND HAD TO TAKE MEDS FOR IT; CURRENTLY NOT TAKING ANY MEDS FOR BP/HAS NOT BEEN AN ISSUE THIS PREGNANCY; PT RECENTLY TREATED FOR UTI; NO HEADACHE, BLURRED VISION, OR UNUSUAL SWELLING

## 2018-03-07 NOTE — ED Notes (Signed)
ED AND OB PROVIDER CONSULTED; PT D/C'D HOME

## 2018-06-03 ENCOUNTER — Other Ambulatory Visit: Payer: Self-pay

## 2018-06-03 ENCOUNTER — Emergency Department (HOSPITAL_COMMUNITY)
Admission: EM | Admit: 2018-06-03 | Discharge: 2018-06-03 | Discharge status: Absent without leave | Payer: Medicaid Other

## 2018-08-20 ENCOUNTER — Encounter (HOSPITAL_COMMUNITY): Payer: Self-pay

## 2018-11-05 ENCOUNTER — Emergency Department (HOSPITAL_COMMUNITY): Payer: Medicaid Other

## 2018-11-05 ENCOUNTER — Other Ambulatory Visit: Payer: Self-pay

## 2018-11-05 ENCOUNTER — Encounter (HOSPITAL_COMMUNITY): Payer: Self-pay

## 2018-11-05 ENCOUNTER — Emergency Department (HOSPITAL_COMMUNITY)
Admission: EM | Admit: 2018-11-05 | Discharge: 2018-11-05 | Disposition: A | Discharge status: Home / Self Care | Payer: Medicaid Other | Source: Home / Self Care | Attending: Emergency Medicine | Admitting: Emergency Medicine

## 2018-11-05 ENCOUNTER — Emergency Department (HOSPITAL_COMMUNITY)
Admission: EM | Admit: 2018-11-05 | Discharge: 2018-11-05 | Discharge status: Against Medical Advice | Payer: Medicaid Other | Attending: Emergency Medicine | Admitting: Emergency Medicine

## 2018-11-05 ENCOUNTER — Encounter (HOSPITAL_COMMUNITY): Payer: Self-pay | Admitting: Emergency Medicine

## 2018-11-05 DIAGNOSIS — R079 Chest pain, unspecified: Secondary | ICD-10-CM

## 2018-11-05 DIAGNOSIS — Z5321 Procedure and treatment not carried out due to patient leaving prior to being seen by health care provider: Secondary | ICD-10-CM | POA: Diagnosis not present

## 2018-11-05 DIAGNOSIS — Z79899 Other long term (current) drug therapy: Secondary | ICD-10-CM | POA: Insufficient documentation

## 2018-11-05 DIAGNOSIS — I10 Essential (primary) hypertension: Secondary | ICD-10-CM | POA: Insufficient documentation

## 2018-11-05 DIAGNOSIS — R0789 Other chest pain: Secondary | ICD-10-CM | POA: Diagnosis present

## 2018-11-05 LAB — CBC
HCT: 39.1 % (ref 36.0–46.0)
Hemoglobin: 12.8 g/dL (ref 12.0–15.0)
MCH: 30 pg (ref 26.0–34.0)
MCHC: 32.7 g/dL (ref 30.0–36.0)
MCV: 91.8 fL (ref 80.0–100.0)
Platelets: 326 10*3/uL (ref 150–400)
RBC: 4.26 MIL/uL (ref 3.87–5.11)
RDW: 13.2 % (ref 11.5–15.5)
WBC: 7.4 10*3/uL (ref 4.0–10.5)
nRBC: 0 % (ref 0.0–0.2)

## 2018-11-05 LAB — BASIC METABOLIC PANEL
Anion gap: 12 (ref 5–15)
BUN: 9 mg/dL (ref 6–20)
CO2: 22 mmol/L (ref 22–32)
Calcium: 9.1 mg/dL (ref 8.9–10.3)
Chloride: 104 mmol/L (ref 98–111)
Creatinine, Ser: 0.87 mg/dL (ref 0.44–1.00)
GFR calc Af Amer: >60 mL/min (ref >60)
GFR calc non Af Amer: >60 mL/min (ref >60)
Glucose, Bld: 108 mg/dL — ABNORMAL HIGH (ref 70–99)
Potassium: 3.8 mmol/L (ref 3.5–5.1)
Sodium: 138 mmol/L (ref 135–145)

## 2018-11-05 LAB — TROPONIN I (HIGH SENSITIVITY): Troponin I (High Sensitivity): <2 ng/L (ref <18)

## 2018-11-05 MED ORDER — KETOROLAC TROMETHAMINE 60 MG/2ML IM SOLN
30.0000 mg | Freq: Once | INTRAMUSCULAR | Status: AC
  Administered 2018-11-05: 05:00:00 30 mg via INTRAMUSCULAR
  Filled 2018-11-05: qty 2

## 2018-11-05 MED ORDER — SODIUM CHLORIDE 0.9% FLUSH: 3.0000 mL | Freq: Once | INTRAVENOUS | Status: DC

## 2018-11-05 NOTE — ED Triage Notes (Signed)
Pt to  ED with c/o mid upper chest pain onset earlier tonight.  Pt denies any nausea or vomiting. Denies radiation of pain

## 2018-11-05 NOTE — ED Provider Notes (Signed)
[facility] EMERGENCY DEPARTMENT Provider Note   CSN: [id number] Arrival date & time: 11/05/18  1950     History   Chief Complaint Chief Complaint  Patient presents with  . Chest Pain    HPI Nancy Morrison is a 33 y.o. female.     The history is provided by the patient.  Chest Pain Pain location:  L chest Pain quality: sharp and tightness   Pain radiates to:  Does not radiate Pain severity:  Mild Timing:  Intermittent Chronicity:  New Relieved by: putting pressure on it. Worsened by:  Certain positions and movement Associated symptoms: no fever, no lower extremity edema, no numbness and no shortness of breath     Past Medical History:  Diagnosis Date  . Hypertension     There are no active problems to display for this patient.   Past Surgical History:  Procedure Laterality Date  . LEG SURGERY     left leg     OB History    Gravida  3   Para  2   Term  2   Preterm  0   AB  0   Living  2     SAB      TAB      Ectopic      Multiple      Live Births               Home Medications    Prior to Admission medications   Medication Sig Start Date End Date Taking? Authorizing Provider  ferrous sulfate 325 (65 FE) MG tablet Take 325 mg by mouth daily with breakfast.    [provider]  nitrofurantoin, macrocrystal-monohydrate, (MACROBID) 100 MG capsule Take 1 capsule (100 mg total) by mouth 2 (two) times daily. 02/20/18   Julianne Handler, CNM  oseltamivir (TAMIFLU) 75 MG capsule Take 1 capsule (75 mg total) by mouth 2 (two) times daily. 02/17/18   Julianne Handler, CNM  Prenatal Vit-Fe Fumarate-FA (PRENATAL VITAMIN) 27-0.8 MG TABS Take 1 tablet by mouth daily. 09/18/17   Evalee Jefferson, PA-C    Family History No family history on file.  Social History Social History   Tobacco Use  . Smoking status: Never Smoker  . Smokeless tobacco: Never Used  Substance Use Topics  . Alcohol use: No  . Drug use: No     Allergies    Lactose intolerance (gi) and Penicillins   Review of Systems Review of Systems  Constitutional: Negative for fever.  Respiratory: Negative for shortness of breath.   Cardiovascular: Positive for chest pain.  Neurological: Negative for numbness.  All other systems reviewed and are negative.    Physical Exam Updated Vital Signs BP 116/76   Pulse (!) 52   Temp 97.8 F (36.6 C) (Oral)   Resp 19   Ht 5\' 8"  (1.727 m)   Wt 106.1 kg   LMP 10/30/2018 (Exact Date)   SpO2 99%   BMI 35.58 kg/m   Physical Exam Vitals signs and nursing note reviewed.  Constitutional:      Appearance: She is well-developed.  HENT:     Head: Normocephalic and atraumatic.  Neck:     Musculoskeletal: Normal range of motion.  Cardiovascular:     Rate and Rhythm: Normal rate and regular rhythm.  Pulmonary:     Effort: No respiratory distress.     Breath sounds: Normal breath sounds. No stridor.  Chest:     Chest wall: Tenderness present.  Abdominal:  General: There is no distension or abdominal bruit.  Musculoskeletal: Normal range of motion.     Right lower leg: She exhibits no tenderness. No edema.     Left lower leg: She exhibits no tenderness. No edema.  Skin:    General: Skin is warm and dry.  Neurological:     Mental Status: She is alert.      ED Treatments / Results  Labs (all labs ordered are listed, but only abnormal results are displayed) Labs Reviewed - No data to display  EKG EKG Interpretation  Date/Time:  Friday November 05 2018 03:42:43 EDT Ventricular Rate:  63 PR Interval:    QRS Duration: 96 QT Interval:  400 QTC Calculation: 410 R Axis:   87 Text Interpretation:  Sinus rhythm no change from <2 hours earlier Confirmed by Marily Memos [phone]) on 11/05/2018 3:56:38 AM   Radiology Dg Chest 2 View  Result Date: 11/05/2018 CLINICAL DATA:  Chest pain EXAM: CHEST - 2 VIEW COMPARISON:  None. FINDINGS: The heart size and mediastinal contours are within normal  limits. Both lungs are clear. The visualized skeletal structures are unremarkable. IMPRESSION: No acute cardiopulmonary process. Electronically Signed   By: Jonna Clark M.D.   On: 11/05/2018 02:45    Procedures Procedures (including critical care time)  Medications Ordered in ED Medications  ketorolac (TORADOL) injection 30 mg (30 mg Intramuscular Given 11/05/18 0441)     Initial Impression / Assessment and Plan / ED Course  I have reviewed the triage vital signs and the nursing notes.  Pertinent labs & imaging results that were available during my care of the patient were reviewed by me and considered in my medical decision making (see chart for details).        Likely MSK. Perc negative.  Low risk for ACS, reviewed labs from [facility] showed negative troponin, no change in ecgs.  No e/o infection.   Final Clinical Impressions(s) / ED Diagnoses   Final diagnoses:  Nonspecific chest pain    ED Discharge Orders    None       Belanna Manring, Barbara Cower, MD 11/05/18 [phone]

## 2018-11-05 NOTE — ED Triage Notes (Signed)
Pt reports chest tightness while at work tonight, states she went to [facility], but states she was told the wait was very long, so she left and came here instead since she lives close by.   Pt states tightness is worse with certain movements.

## 2018-12-07 ENCOUNTER — Encounter (HOSPITAL_COMMUNITY): Payer: Self-pay | Admitting: *Deleted

## 2018-12-07 ENCOUNTER — Emergency Department (HOSPITAL_COMMUNITY)
Admission: EM | Admit: 2018-12-07 | Discharge: 2018-12-07 | Disposition: A | Discharge status: Home / Self Care | Payer: Medicaid Other | Attending: Emergency Medicine | Admitting: Emergency Medicine

## 2018-12-07 ENCOUNTER — Other Ambulatory Visit: Payer: Self-pay

## 2018-12-07 DIAGNOSIS — Y929 Unspecified place or not applicable: Secondary | ICD-10-CM | POA: Insufficient documentation

## 2018-12-07 DIAGNOSIS — I10 Essential (primary) hypertension: Secondary | ICD-10-CM | POA: Insufficient documentation

## 2018-12-07 DIAGNOSIS — Z3202 Encounter for pregnancy test, result negative: Secondary | ICD-10-CM | POA: Diagnosis not present

## 2018-12-07 DIAGNOSIS — X58XXXA Exposure to other specified factors, initial encounter: Secondary | ICD-10-CM | POA: Diagnosis not present

## 2018-12-07 DIAGNOSIS — Z79899 Other long term (current) drug therapy: Secondary | ICD-10-CM | POA: Insufficient documentation

## 2018-12-07 DIAGNOSIS — R197 Diarrhea, unspecified: Secondary | ICD-10-CM | POA: Diagnosis not present

## 2018-12-07 DIAGNOSIS — Y999 Unspecified external cause status: Secondary | ICD-10-CM | POA: Diagnosis not present

## 2018-12-07 DIAGNOSIS — S39012A Strain of muscle, fascia and tendon of lower back, initial encounter: Secondary | ICD-10-CM | POA: Diagnosis not present

## 2018-12-07 DIAGNOSIS — S3992XA Unspecified injury of lower back, initial encounter: Secondary | ICD-10-CM | POA: Diagnosis present

## 2018-12-07 DIAGNOSIS — Y939 Activity, unspecified: Secondary | ICD-10-CM | POA: Insufficient documentation

## 2018-12-07 LAB — URINALYSIS, ROUTINE W REFLEX MICROSCOPIC
Bilirubin Urine: NEGATIVE
Glucose, UA: NEGATIVE mg/dL
Hgb urine dipstick: NEGATIVE
Ketones, ur: NEGATIVE mg/dL
Leukocytes,Ua: NEGATIVE
Nitrite: NEGATIVE
Protein, ur: NEGATIVE mg/dL
Specific Gravity, Urine: 1.03 (ref 1.005–1.030)
pH: 6 (ref 5.0–8.0)

## 2018-12-07 LAB — PREGNANCY, URINE: Preg Test, Ur: NEGATIVE

## 2018-12-07 MED ORDER — LOPERAMIDE HCL 2 MG PO CAPS
4.0000 mg | ORAL_CAPSULE | Freq: Once | ORAL | Status: AC
  Administered 2018-12-07: 4 mg via ORAL
  Filled 2018-12-07: qty 2

## 2018-12-07 NOTE — ED Triage Notes (Signed)
Pt c/o back pain, nausea and diarrhea

## 2018-12-07 NOTE — ED Provider Notes (Signed)
[facility] EMERGENCY DEPARTMENT Provider Note   CSN: [id number] Arrival date & time: 12/07/18  0002     History   Chief Complaint Chief Complaint  Patient presents with  . Back Pain    HPI Nancy Morrison is a 33 y.o. female.     The history is provided by the patient.  Back Pain Location:  Lumbar spine Quality:  Aching Radiates to:  Does not radiate Pain severity:  Mild Onset quality:  Gradual Timing:  Constant Progression:  Unchanged Chronicity:  Chronic Relieved by:  Nothing Worsened by:  Nothing Associated symptoms: no abdominal pain, no bladder incontinence, no dysuria, no fever and no weakness    Patient reports he has had back pain and nausea for "a while "is unchanged.  No new weakness.  Tonight she was driving to work and she had an episode of diarrhea on herself while driving.  She was unable to go to work so she came for evaluation.  It was nonbloody diarrhea.  No vomiting. No other acute complaints Past Medical History:  Diagnosis Date  . Hypertension     There are no active problems to display for this patient.   Past Surgical History:  Procedure Laterality Date  . LEG SURGERY     left leg     OB History    Gravida  3   Para  2   Term  2   Preterm  0   AB  0   Living  2     SAB      TAB      Ectopic      Multiple      Live Births               Home Medications    Prior to Admission medications   Medication Sig Start Date End Date Taking? Authorizing Provider  ferrous sulfate 325 (65 FE) MG tablet Take 325 mg by mouth daily with breakfast.    [provider]    Family History History reviewed. No pertinent family history.  Social History Social History   Tobacco Use  . Smoking status: Never Smoker  . Smokeless tobacco: Never Used  Substance Use Topics  . Alcohol use: No  . Drug use: No     Allergies   Lactose intolerance (gi) and Penicillins   Review of Systems Review of Systems   Constitutional: Negative for fever.  Gastrointestinal: Negative for abdominal pain and blood in stool.  Genitourinary: Negative for bladder incontinence, dysuria, vaginal bleeding and vaginal discharge.  Musculoskeletal: Positive for back pain.  Neurological: Negative for weakness.  All other systems reviewed and are negative.    Physical Exam Updated Vital Signs BP (!) 141/92 (BP Location: Right Arm)   Pulse 83   Temp 98.4 F (36.9 C) (Oral)   Resp 18   Ht 1.727 m (5\' 8" )   Wt 108.9 kg   SpO2 100%   BMI 36.49 kg/m   Physical Exam CONSTITUTIONAL: Well developed/well nourished, she appears comfortable HEAD: Normocephalic/atraumatic EYES: EOMI ENMT: Mucous membranes moist NECK: supple no meningeal signs SPINE/BACK:mild lumbar tenderness, No bruising/crepitance/stepoffs noted to spine CV: S1/S2 noted, no murmurs/rubs/gallops noted LUNGS: Lungs are clear to auscultation bilaterally, no apparent distress ABDOMEN: soft, nontender, no rebound or guarding GU:no cva tenderness NEURO: Awake/alert, equal motor 5/5 strength noted with the following: hip flexion/knee flexion/extension, foot dorsi/plantar flexion, no sensory deficit in any dermatome.   Pt is able to ambulate unassisted. EXTREMITIES: pulses normal,  full ROM SKIN: warm, color normal PSYCH: no abnormalities of mood noted, alert and oriented to situation  ED Treatments / Results  Labs (all labs ordered are listed, but only abnormal results are displayed) Labs Reviewed  URINALYSIS, ROUTINE W REFLEX MICROSCOPIC  PREGNANCY, URINE    EKG None  Radiology No results found.  Procedures Procedures    Medications Ordered in ED Medications  loperamide (IMODIUM) capsule 4 mg (4 mg Oral Given 12/07/18 0048)     Initial Impression / Assessment and Plan / ED Course  I have reviewed the triage vital signs and the nursing notes.  Pertinent labs  results that were available during my care of the patient were reviewed by  me and considered in my medical decision making (see chart for details).        Patient came in the emergency department because she was on her way to work when she had diarrhea. She is in no acute distress at this time.  She reports has had nausea and back pain for an undetermined amount of time.  Her exam is unremarkable.  Her labs are reassuring.  Will discharge.  Final Clinical Impressions(s) / ED Diagnoses   Final diagnoses:  None    ED Discharge Orders    None       Ripley Fraise, MD 12/07/18 [phone]

## 2019-07-26 ENCOUNTER — Other Ambulatory Visit: Payer: Self-pay

## 2019-07-26 ENCOUNTER — Emergency Department (HOSPITAL_COMMUNITY)
Admission: EM | Admit: 2019-07-26 | Discharge: 2019-07-27 | Disposition: A | Discharge status: Home / Self Care | Payer: Medicaid Other | Attending: Emergency Medicine | Admitting: Emergency Medicine

## 2019-07-26 ENCOUNTER — Encounter (HOSPITAL_COMMUNITY): Payer: Self-pay | Admitting: Emergency Medicine

## 2019-07-26 DIAGNOSIS — I10 Essential (primary) hypertension: Secondary | ICD-10-CM | POA: Insufficient documentation

## 2019-07-26 DIAGNOSIS — R531 Weakness: Secondary | ICD-10-CM | POA: Insufficient documentation

## 2019-07-26 DIAGNOSIS — Z3490 Encounter for supervision of normal pregnancy, unspecified, unspecified trimester: Secondary | ICD-10-CM | POA: Diagnosis not present

## 2019-07-26 DIAGNOSIS — Z3A00 Weeks of gestation of pregnancy not specified: Secondary | ICD-10-CM | POA: Diagnosis not present

## 2019-07-26 DIAGNOSIS — Z20822 Contact with and (suspected) exposure to covid-19: Secondary | ICD-10-CM | POA: Diagnosis not present

## 2019-07-26 DIAGNOSIS — M545 Low back pain, unspecified: Secondary | ICD-10-CM

## 2019-07-26 DIAGNOSIS — R42 Dizziness and giddiness: Secondary | ICD-10-CM | POA: Insufficient documentation

## 2019-07-26 DIAGNOSIS — O26899 Other specified pregnancy related conditions, unspecified trimester: Secondary | ICD-10-CM | POA: Insufficient documentation

## 2019-07-26 LAB — COMPREHENSIVE METABOLIC PANEL
ALT: 14 U/L (ref 0–44)
AST: 16 U/L (ref 15–41)
Albumin: 4.2 g/dL (ref 3.5–5.0)
Alkaline Phosphatase: 52 U/L (ref 38–126)
Anion gap: 12 (ref 5–15)
BUN: 9 mg/dL (ref 6–20)
CO2: 21 mmol/L — ABNORMAL LOW (ref 22–32)
Calcium: 9 mg/dL (ref 8.9–10.3)
Chloride: 102 mmol/L (ref 98–111)
Creatinine, Ser: 0.74 mg/dL (ref 0.44–1.00)
GFR calc Af Amer: >60 mL/min (ref >60)
GFR calc non Af Amer: >60 mL/min (ref >60)
Glucose, Bld: 97 mg/dL (ref 70–99)
Potassium: 3.8 mmol/L (ref 3.5–5.1)
Sodium: 135 mmol/L (ref 135–145)
Total Bilirubin: 0.8 mg/dL (ref 0.3–1.2)
Total Protein: 8.1 g/dL (ref 6.5–8.1)

## 2019-07-26 LAB — CBC WITH DIFFERENTIAL/PLATELET
Abs Immature Granulocytes: 0.05 10*3/uL (ref 0.00–0.07)
Basophils Absolute: 0.1 10*3/uL (ref 0.0–0.1)
Basophils Relative: 1 %
Eosinophils Absolute: 0.3 10*3/uL (ref 0.0–0.5)
Eosinophils Relative: 4 %
HCT: 41.3 % (ref 36.0–46.0)
Hemoglobin: 13.3 g/dL (ref 12.0–15.0)
Immature Granulocytes: 1 %
Lymphocytes Relative: 15 %
Lymphs Abs: 1.4 10*3/uL (ref 0.7–4.0)
MCH: 29 pg (ref 26.0–34.0)
MCHC: 32.2 g/dL (ref 30.0–36.0)
MCV: 90 fL (ref 80.0–100.0)
Monocytes Absolute: 0.9 10*3/uL (ref 0.1–1.0)
Monocytes Relative: 10 %
Neutro Abs: 6.5 10*3/uL (ref 1.7–7.7)
Neutrophils Relative %: 69 %
Platelets: 302 10*3/uL (ref 150–400)
RBC: 4.59 MIL/uL (ref 3.87–5.11)
RDW: 13.8 % (ref 11.5–15.5)
WBC: 9.3 10*3/uL (ref 4.0–10.5)
nRBC: 0 % (ref 0.0–0.2)

## 2019-07-26 LAB — URINALYSIS, ROUTINE W REFLEX MICROSCOPIC
Bilirubin Urine: NEGATIVE
Glucose, UA: NEGATIVE mg/dL
Hgb urine dipstick: NEGATIVE
Ketones, ur: NEGATIVE mg/dL
Leukocytes,Ua: NEGATIVE
Nitrite: NEGATIVE
Protein, ur: NEGATIVE mg/dL
Specific Gravity, Urine: 1.02 (ref 1.005–1.030)
pH: 7 (ref 5.0–8.0)

## 2019-07-26 LAB — PREGNANCY, URINE: Preg Test, Ur: POSITIVE — AB

## 2019-07-26 LAB — HCG, QUANTITATIVE, PREGNANCY: hCG, Beta Chain, Quant, S: 103 m[IU]/mL — ABNORMAL HIGH (ref <5)

## 2019-07-26 NOTE — ED Triage Notes (Signed)
Pt reports back pain for the last 2 days  Then cough   Light headed   Dizzy   Has taken no meds   Has no PCP

## 2019-07-26 NOTE — ED Provider Notes (Signed)
[facility] EMERGENCY DEPARTMENT Provider Note   CSN: [id number] Arrival date & time: 07/26/19  2034     History Chief Complaint  Patient presents with  . Back Pain    REEANNA Morrison is a 34 y.o. female.  Patient presents with a several week history of low back pain.  She believes this is from sitting too much at work.  Is not taking any medication for pain.  Denies any fall or trauma.  The pain is across her entire low back does not radiate down her buttocks or legs.  No focal weakness, numbness or tingling.  No bowel or bladder incontinence.  No fever or vomiting.  No chest pain or shortness of breath. Patient comes in tonight because she felt lightheaded and dizzy while she was at work today.  Admits that she did not eat or drink much of anything today.  She recently found out she is pregnant but does not know when her last menstrual cycle was.  She was seen at [facility] yesterday had a positive pregnancy test.  She told them her last period was at the beginning of June but she says her periods are very irregular and she is not certain.  She is not feeling dizzy currently.  Did have a dry nonproductive cough today for some congestion and runny nose.  No fever.  No sore throat.  No difficulty breathing or chest pain. No abdominal pain or vaginal bleeding.  The history is provided by the patient.  Back Pain Associated symptoms: weakness   Associated symptoms: no abdominal pain, no chest pain, no dysuria and no fever        Past Medical History:  Diagnosis Date  . Hypertension     There are no problems to display for this patient.   Past Surgical History:  Procedure Laterality Date  . LEG SURGERY     left leg     OB History    Gravida  3   Para  2   Term  2   Preterm  0   AB  0   Living  2     SAB      TAB      Ectopic      Multiple      Live Births              No family history on file.  Social History   Tobacco Use  . Smoking status:  Never Smoker  . Smokeless tobacco: Never Used  Vaping Use  . Vaping Use: Never used  Substance Use Topics  . Alcohol use: No  . Drug use: No    Home Medications Prior to Admission medications   Not on File    Allergies    Lactose intolerance (gi) and Penicillins  Review of Systems   Review of Systems  Constitutional: Positive for fatigue. Negative for activity change, appetite change and fever.  HENT: Negative for congestion, mouth sores and rhinorrhea.   Respiratory: Positive for cough. Negative for chest tightness and shortness of breath.   Cardiovascular: Negative for chest pain, palpitations and leg swelling.  Gastrointestinal: Negative for abdominal pain, nausea and vomiting.  Genitourinary: Negative for dysuria, urgency and vaginal bleeding.  Musculoskeletal: Positive for back pain. Negative for neck pain and neck stiffness.  Skin: Negative for rash.  Neurological: Positive for dizziness, weakness and light-headedness.   all other systems are negative except as noted in the HPI and PMH.    Physical Exam  Updated Vital Signs BP (!) 119/106 (BP Location: Right Arm)   Pulse 100   Temp 99.2 F (37.3 C) (Oral)   Resp 20   Ht 5\' 8"  (1.727 m)   Wt 115.2 kg   SpO2 96%   BMI 38.62 kg/m   Physical Exam Vitals and nursing note reviewed.  Constitutional:      General: She is not in acute distress.    Appearance: She is well-developed. She is obese.  HENT:     Head: Normocephalic and atraumatic.     Mouth/Throat:     Pharynx: No oropharyngeal exudate.  Eyes:     Conjunctiva/sclera: Conjunctivae normal.     Pupils: Pupils are equal, round, and reactive to light.  Neck:     Comments: No meningismus. Cardiovascular:     Rate and Rhythm: Normal rate and regular rhythm.     Heart sounds: Normal heart sounds. No murmur heard.      Comments: Dry cough, lungs clear, no respiratory distress Pulmonary:     Effort: Pulmonary effort is normal. No respiratory distress.      Breath sounds: Normal breath sounds. No wheezing.  Abdominal:     Palpations: Abdomen is soft.     Tenderness: There is no abdominal tenderness. There is no guarding or rebound.  Musculoskeletal:        General: Tenderness present. Normal range of motion.     Cervical back: Normal range of motion and neck supple.     Comments: Paraspinal lumbar tenderness, no midline tenderness  Skin:    General: Skin is warm.     Capillary Refill: Capillary refill takes less than 2 seconds.  Neurological:     General: No focal deficit present.     Mental Status: She is alert and oriented to person, place, and time. Mental status is at baseline.     Cranial Nerves: No cranial nerve deficit.     Motor: No abnormal muscle tone.     Coordination: Coordination normal.     Comments:  5/5 strength throughout. CN 2-12 intact.Equal grip strength.   Psychiatric:        Behavior: Behavior normal.     ED Results / Procedures / Treatments   Labs (all labs ordered are listed, but only abnormal results are displayed) Labs Reviewed  PREGNANCY, URINE - Abnormal; Notable for the following components:      Result Value   Preg Test, Ur POSITIVE (*)    All other components within normal limits  COMPREHENSIVE METABOLIC PANEL - Abnormal; Notable for the following components:   CO2 21 (*)    All other components within normal limits  HCG, QUANTITATIVE, PREGNANCY - Abnormal; Notable for the following components:   hCG, Beta Chain, Quant, S 103 (*)    All other components within normal limits  URINALYSIS, ROUTINE W REFLEX MICROSCOPIC - Abnormal; Notable for the following components:   APPearance CLOUDY (*)    All other components within normal limits  SARS CORONAVIRUS 2 BY RT PCR (HOSPITAL ORDER, PERFORMED IN  HOSPITAL LAB)  URINE CULTURE  CBC WITH DIFFERENTIAL/PLATELET    EKG None  Radiology No results found.  Procedures Procedures (including critical care time)  Medications Ordered in  ED Medications - No data to display  ED Course  I have reviewed the triage vital signs and the nursing notes.  Pertinent labs & imaging results that were available during my care of the patient were reviewed by me and considered in my medical  decision making (see chart for details).    MDM Rules/Calculators/A&P                         Patient here with several weeks of low back pain without fall or trauma.  Found to be newly pregnant with unknown last menstrual period.  Dry cough with congestion.  No fever.  Vitals are stable.  No distress.  hCG here is 103.  Low suspicion for ectopic pregnancy.  Doubtful pelvic ultrasound will show anything at this point.  Patient's back pain has been ongoing for several weeks prior to her pregnancy.  Orthostatics are positive.  Labs are reassuring.  Patient tolerating p.o. HR 82-100 lying to standing.  Discussed with Dr. Emelda Fear of gynecology.  He agrees emergent ultrasound is going to be low yield given her low hCG.  He recommends repeat hCG in 2 days in the office.  He agrees her low back pain is unlikely to represent ectopic pregnancy with this low hCG.  She has no abdominal pain or vaginal bleeding.  Patient defers chest x-ray given her positive pregnancy test.  Will evaluate for Covid.  Covid swab is negative.  Discussed Covid is still a possibility despite negative test.  Recommend p.o. hydration at home.  Follow-up with gynecology in 2 days for hCG check.  Return to the ED sooner with lower abdominal pain, vaginal bleeding, dizziness lightheadedness or other concerns.  Nancy Morrison was evaluated in Emergency Department on 07/27/2019 for the symptoms described in the history of present illness. She was evaluated in the context of the global COVID-19 pandemic, which necessitated consideration that the patient might be at risk for infection with the SARS-CoV-2 virus that causes COVID-19. Institutional protocols and algorithms that pertain to the  evaluation of patients at risk for COVID-19 are in a state of rapid change based on information released by regulatory bodies including the CDC and federal and state organizations. These policies and algorithms were followed during the patient's care in the ED.  Final Clinical Impression(s) / ED Diagnoses Final diagnoses:  Acute bilateral low back pain without sciatica  Early stage of pregnancy    Rx / DC Orders ED Discharge Orders    None       Jonella Redditt, Jeannett Senior, MD 07/27/19 [phone]

## 2019-07-26 NOTE — ED Notes (Signed)
Pt tolerating oral fluids at this time.  °

## 2019-07-27 LAB — SARS CORONAVIRUS 2 BY RT PCR (HOSPITAL ORDER, PERFORMED IN ~~LOC~~ HOSPITAL LAB): SARS Coronavirus 2: NEGATIVE

## 2019-07-27 NOTE — Discharge Instructions (Signed)
Your Covid test is negative.  You declined chest x-ray today.  Keep yourself hydrated.  You are slightly dehydrated based on your blood pressure and heart rate.  You may use Tylenol as needed for your back pain.  You should [facility] to have a repeat hCG blood test in 2 days to ensure that this pregnancy is developing normally.  Return to the ED with worsening pain, vaginal bleeding, dizziness, lightheadedness, or other concerns.

## 2019-07-28 ENCOUNTER — Other Ambulatory Visit: Payer: Medicaid Other

## 2019-07-28 ENCOUNTER — Other Ambulatory Visit: Payer: Self-pay

## 2019-07-28 DIAGNOSIS — Z3201 Encounter for pregnancy test, result positive: Secondary | ICD-10-CM

## 2019-07-28 LAB — URINE CULTURE: Culture: 20000 — AB

## 2019-07-29 LAB — BETA HCG QUANT (REF LAB): hCG Quant: 195 m[IU]/mL

## 2019-08-04 ENCOUNTER — Encounter: Payer: Medicaid Other | Admitting: Adult Health

## 2019-08-05 ENCOUNTER — Other Ambulatory Visit: Payer: Self-pay

## 2019-08-05 ENCOUNTER — Encounter (HOSPITAL_COMMUNITY): Payer: Self-pay

## 2019-08-05 ENCOUNTER — Emergency Department (HOSPITAL_COMMUNITY)
Admission: EM | Admit: 2019-08-05 | Discharge: 2019-08-06 | Disposition: A | Discharge status: Home / Self Care | Payer: Medicaid Other | Attending: Emergency Medicine | Admitting: Emergency Medicine

## 2019-08-05 DIAGNOSIS — Z5321 Procedure and treatment not carried out due to patient leaving prior to being seen by health care provider: Secondary | ICD-10-CM | POA: Insufficient documentation

## 2019-08-05 DIAGNOSIS — R109 Unspecified abdominal pain: Secondary | ICD-10-CM | POA: Insufficient documentation

## 2019-08-05 LAB — HCG, SERUM, QUALITATIVE: Preg, Serum: POSITIVE — AB

## 2019-08-05 NOTE — ED Triage Notes (Signed)
Pt to er, pt states that she was seen here a few days ago with some lightheadedness, states that she found out that she was pregnant at this time,  States that she was told to come back if she was having any abd pain.  States that today she had some abd cramping and there was a "glob of mucous" in the toilet today.  Pt states she has not seen and md for the pregnancy but has had some blood drawn for it.

## 2019-08-05 NOTE — ED Notes (Signed)
Pt called x3 to be roomed. No response. °

## 2020-02-13 ENCOUNTER — Encounter (HOSPITAL_COMMUNITY): Payer: Self-pay | Admitting: *Deleted

## 2020-02-13 ENCOUNTER — Emergency Department (HOSPITAL_COMMUNITY)
Admission: EM | Admit: 2020-02-13 | Discharge: 2020-02-13 | Disposition: A | Discharge status: Home / Self Care | Payer: Medicaid Other | Attending: Emergency Medicine | Admitting: Emergency Medicine

## 2020-02-13 ENCOUNTER — Other Ambulatory Visit: Payer: Self-pay

## 2020-02-13 DIAGNOSIS — I10 Essential (primary) hypertension: Secondary | ICD-10-CM | POA: Diagnosis not present

## 2020-02-13 DIAGNOSIS — U071 COVID-19: Secondary | ICD-10-CM | POA: Diagnosis not present

## 2020-02-13 DIAGNOSIS — B349 Viral infection, unspecified: Secondary | ICD-10-CM | POA: Diagnosis not present

## 2020-02-13 DIAGNOSIS — R059 Cough, unspecified: Secondary | ICD-10-CM | POA: Diagnosis present

## 2020-02-13 LAB — RESP PANEL BY RT-PCR (FLU A&B, COVID) ARPGX2
Influenza A by PCR: NEGATIVE
Influenza B by PCR: NEGATIVE
SARS Coronavirus 2 by RT PCR: POSITIVE — AB

## 2020-02-13 NOTE — Discharge Instructions (Addendum)
Your history and physical exam is suggestive of a viral illness.  You have been tested for COVID-19.  You will receive your results in the next 2 to 3 hours.  Please follow-up with your OB/GYN 1st thing tomorrow morning regarding the results.  Please maintain isolation precautions.  Check your temperature regularly and take Tylenol as needed for fever control.  Increase your oral hydration and continue to eat regular meals to avoid electrolyte derangement and further fatigue.  I recommend pregnancy-safe over-the-counter medications as needed for symptom relief.  You may wish to consider obtaining a pulse oximeter over-the-counter at a pharmacy.  If you are below 90% oxygen saturation, you will need to return to the ED.   Given that you are high risk, I have placed an ambulatory referral to the outpatient infusion center. They will call you to determine if you are a viable candidate for treatment. This only applies if you are COVID-19 positive.  Follow-up with your primary care provider regarding today's encounter and for ongoing management.  If you do not have one, please get established with one as soon as possible.  If you do not have insurance, please consider the [facility] and [facility].  Return to the ED or seek immediate medical attention should you experience any new or worsening symptoms.

## 2020-02-13 NOTE — ED Provider Notes (Signed)
[facility] EMERGENCY DEPARTMENT Provider Note   CSN: [id number] Arrival date & time: 02/13/20  1359     History Chief Complaint  Patient presents with  . Headache    Nancy Morrison is a 35 y.o. female [redacted] weeks gestation who presents to the ED with 3-day history of cold symptoms.  Patient reports that for the past few days she has been experiencing headache, sinus congestion, sore throat, and nonproductive cough.  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  She went to [facility] today and fetal heart rate was obtained and within normal limits.  She states that the fetal well her exam was reassuring.  She denies any vaginal bleeding or significant abdominal pain.  She plans to follow-up with her OB/GYN Dr. Ralph Dowdy tomorrow AM.    However, she was not tested for COVID-19 or influenza and given her symptoms she is concerned.  She does not want to go home to her 21-year-old child and potentially expose them.  She denies any fevers or chills, body aches, diminished appetite, inability to eat or drink, chest pain, difficulty breathing, nausea or emesis, urinary symptoms, numbness or weakness, blurred vision, difficulty opening her mouth, or other symptoms.  HPI     Past Medical History:  Diagnosis Date  . Hypertension     There are no problems to display for this patient.   Past Surgical History:  Procedure Laterality Date  . LEG SURGERY     left leg     OB History    Gravida  3   Para  2   Term  2   Preterm  0   AB  0   Living  2     SAB      IAB      Ectopic      Multiple      Live Births              No family history on file.  Social History   Tobacco Use  . Smoking status: Never Smoker  . Smokeless tobacco: Never Used  Vaping Use  . Vaping Use: Never used  Substance Use Topics  . Alcohol use: No  . Drug use: No    Home Medications Prior to Admission medications   Not  on File    Allergies    Lactose intolerance (gi) and Penicillins  Review of Systems   Review of Systems  All other systems reviewed and are negative.   Physical Exam Updated Vital Signs BP 121/77 (BP Location: Right Arm)   Pulse (!) 104   Temp 98.2 F (36.8 C) (Oral)   Resp 18   SpO2 98%   Physical Exam Vitals and nursing note reviewed. Exam conducted with a chaperone present.  Constitutional:      General: She is not in acute distress.    Appearance: She is not toxic-appearing.  HENT:     Head: Normocephalic and atraumatic.     Mouth/Throat:     Pharynx: Oropharynx is clear. No oropharyngeal exudate.     Comments: Patent oropharynx.  No exudates noted.  No significant erythema.  Uvula midline.  No trismus.  Tolerating secretions well. Eyes:     General: No scleral icterus.    Conjunctiva/sclera: Conjunctivae normal.  Cardiovascular:     Rate and Rhythm: Normal rate and regular rhythm.     Pulses: Normal pulses.  Heart sounds: Normal heart sounds.  Pulmonary:     Effort: Pulmonary effort is normal. No respiratory distress.     Breath sounds: Normal breath sounds. No wheezing or rales.  Abdominal:     Tenderness: There is no abdominal tenderness.     Comments: Protuberant abdomen (pregnant).  Skin:    General: Skin is dry.  Neurological:     Mental Status: She is alert.     GCS: GCS eye subscore is 4. GCS verbal subscore is 5. GCS motor subscore is 6.  Psychiatric:        Mood and Affect: Mood normal.        Behavior: Behavior normal.        Thought Content: Thought content normal.     ED Results / Procedures / Treatments   Labs (all labs ordered are listed, but only abnormal results are displayed) Labs Reviewed - No data to display  EKG None  Radiology No results found.  Procedures Procedures (including critical care time)  Medications Ordered in ED Medications - No data to display  ED Course  I have reviewed the triage vital signs and the  nursing notes.  Pertinent labs & imaging results that were available during my care of the patient were reviewed by me and considered in my medical decision making (see chart for details).    MDM Rules/Calculators/A&P                          Patient with symptoms consistent with viral illness for  days.  COVID-19 test is obtained and pending.  Discussed influenza testing and given that she is immunocompromised being 33w gestation, will proceed with testing.  While she is outside of initial 48-hour window, she can discuss Tamiflu with her OB/GYN tomorrow morning if she is positive.  Given brevity of illness and reassuring physical exam, do not feel as though imaging of chest is warranted. Their symptoms are likely of viral etiology and we discussed that antibiotics are not indicated for viral infections.  Patient will be discharged with symptomatic treatment.  Patient is tolerating food and liquid without difficulty and I do not believe that laboratory work-up would yield any significant findings.  Low suspicion for electrolyte derangement, but emphasized the importance of eating regularly.  I also emphasized the importance of rest, continued oral hydration, and antipyretics as needed for fever control.    Patient is not demonstrating any increased work of breathing or exertional hypoxia.  However, given that they are high risk for poor outcomes given unvaccinated, obese, and 33w gestation, will place ambulatory orders for outpatient infusion, if deemed to be qualified candidate per infusion center.  They understand that they will not proceed with treatment if their COVID-19 test is negative.   They were provided opportunity to ask any additional questions and have none at this time.  Prior to discharge patient is feeling well, agreeable with plan for discharge home.  They have expressed understanding of verbal discharge instructions as well as return precautions and are agreeable to the plan.     Final Clinical Impression(s) / ED Diagnoses Final diagnoses:  Viral illness    Rx / DC Orders ED Discharge Orders         Ordered    Ambulatory referral for Covid Treatment        02/13/20 1731           Lorelee New, PA-C 02/13/20 1733  Derwood Kaplan, MD 02/13/20 [phone]

## 2020-02-13 NOTE — ED Triage Notes (Signed)
Headache with sore throat for 3 days

## 2020-02-14 ENCOUNTER — Telehealth: Payer: Self-pay | Admitting: Physician Assistant

## 2020-02-14 ENCOUNTER — Telehealth: Payer: Self-pay

## 2020-02-14 NOTE — Telephone Encounter (Signed)
Patient called and was told that her COVID-19 test done yesterday was positive and she can pass the germ to others.  She states she feels like she has a sinus infection. Symptom tiers and treatment plans were discussed with patient.  Criteria for ending isolation 5 days isolating respiratory symptoms improving and no fever and off all fever reducing medication for 24 hours. Then 5 days wearing a mask in public. Avoid eating out. She was told retesting is not recommended for 90 days.  She states that her boss needs her to have negative test.  She was told to wait for symptoms to be gone and at least 2 weeks before retest if necessary for her employer.  Good preventative practices were discussed. Patient states she will call her OB as suggested in her ER visit yesterday. She verbalized understanding of all information.

## 2020-02-14 NOTE — Telephone Encounter (Signed)
Called to discuss with patient about COVID-19 symptoms and the use of one of the available treatments for those with mild to moderate Covid symptoms and at a high risk of hospitalization.  Pt appears to qualify for outpatient treatment due to co-morbid conditions and/or a member of an at-risk group in accordance with the FDA Emergency Use Authorization.    Symptom onset: Unknown Vaccinated: NO per ER note Booster? N/A  Qualifiers:[redacted] weeks pregnant, obesity  Unable to reach pt. Left voice mail to call back and sent my chart message.   [employer]

## 2020-06-30 ENCOUNTER — Other Ambulatory Visit: Payer: Self-pay

## 2020-06-30 ENCOUNTER — Emergency Department (HOSPITAL_COMMUNITY)
Admission: EM | Admit: 2020-06-30 | Discharge: 2020-06-30 | Disposition: A | Discharge status: Home / Self Care | Payer: Medicaid Other | Attending: Emergency Medicine | Admitting: Emergency Medicine

## 2020-06-30 ENCOUNTER — Encounter (HOSPITAL_COMMUNITY): Payer: Self-pay | Admitting: Emergency Medicine

## 2020-06-30 ENCOUNTER — Emergency Department (HOSPITAL_COMMUNITY): Payer: Medicaid Other

## 2020-06-30 DIAGNOSIS — S29019A Strain of muscle and tendon of unspecified wall of thorax, initial encounter: Secondary | ICD-10-CM | POA: Diagnosis not present

## 2020-06-30 DIAGNOSIS — S299XXA Unspecified injury of thorax, initial encounter: Secondary | ICD-10-CM | POA: Diagnosis present

## 2020-06-30 DIAGNOSIS — X500XXA Overexertion from strenuous movement or load, initial encounter: Secondary | ICD-10-CM | POA: Diagnosis not present

## 2020-06-30 DIAGNOSIS — Y92810 Car as the place of occurrence of the external cause: Secondary | ICD-10-CM | POA: Insufficient documentation

## 2020-06-30 DIAGNOSIS — S29011A Strain of muscle and tendon of front wall of thorax, initial encounter: Secondary | ICD-10-CM

## 2020-06-30 DIAGNOSIS — R110 Nausea: Secondary | ICD-10-CM | POA: Insufficient documentation

## 2020-06-30 DIAGNOSIS — I10 Essential (primary) hypertension: Secondary | ICD-10-CM | POA: Diagnosis not present

## 2020-06-30 LAB — CBC
HCT: 38.6 % (ref 36.0–46.0)
Hemoglobin: 11.8 g/dL — ABNORMAL LOW (ref 12.0–15.0)
MCH: 27.1 pg (ref 26.0–34.0)
MCHC: 30.6 g/dL (ref 30.0–36.0)
MCV: 88.5 fL (ref 80.0–100.0)
Platelets: 392 10*3/uL (ref 150–400)
RBC: 4.36 MIL/uL (ref 3.87–5.11)
RDW: 15.9 % — ABNORMAL HIGH (ref 11.5–15.5)
WBC: 9.2 10*3/uL (ref 4.0–10.5)
nRBC: 0 % (ref 0.0–0.2)

## 2020-06-30 LAB — BASIC METABOLIC PANEL
Anion gap: 6 (ref 5–15)
BUN: 10 mg/dL (ref 6–20)
CO2: 28 mmol/L (ref 22–32)
Calcium: 8.4 mg/dL — ABNORMAL LOW (ref 8.9–10.3)
Chloride: 102 mmol/L (ref 98–111)
Creatinine, Ser: 0.78 mg/dL (ref 0.44–1.00)
GFR, Estimated: >60 mL/min (ref >60)
Glucose, Bld: 80 mg/dL (ref 70–99)
Potassium: 3.5 mmol/L (ref 3.5–5.1)
Sodium: 136 mmol/L (ref 135–145)

## 2020-06-30 LAB — TROPONIN I (HIGH SENSITIVITY): Troponin I (High Sensitivity): <2 ng/L (ref <18)

## 2020-06-30 LAB — HCG, SERUM, QUALITATIVE: Preg, Serum: NEGATIVE

## 2020-06-30 MED ORDER — IBUPROFEN 600 MG PO TABS: 600.0000 mg | ORAL_TABLET | Freq: Four times a day (QID) | ORAL | 0 refills | Status: DC | PRN

## 2020-06-30 MED ORDER — KETOROLAC TROMETHAMINE 30 MG/ML IJ SOLN
15.0000 mg | Freq: Once | INTRAMUSCULAR | Status: AC
  Administered 2020-06-30: 15 mg via INTRAVENOUS
  Filled 2020-06-30: qty 1

## 2020-06-30 NOTE — ED Triage Notes (Signed)
Pt c/o generalized chest pain that started after trying to pick up her childs car seat.

## 2020-06-30 NOTE — Discharge Instructions (Addendum)
Your lab tests and x-rays including your EKG are reassuring today.  Your exam and the mechanism of onset of your symptoms point to this being a chest wall musculoskeletal strain.  Take the ibuprofen as prescribed.  I also recommend ice packs to your chest wall is much as is comfortable for the next several days to help reduce inflammation.  After the second day you may also add a heating pad for 20 minutes 3 times daily.  Plan to get rechecked if this does not improve your symptoms over the next week to 10 days.

## 2020-06-30 NOTE — ED Provider Notes (Signed)
[facility] EMERGENCY DEPARTMENT Provider Note   CSN: [id number] Arrival date & time: 06/30/20  2128     History Chief Complaint  Patient presents with  . Chest Pain    Nancy Morrison is a 35 y.o. female with a history of hypertension only, presenting to the ED for evaluation of midsternal chest pain which started suddenly when she was picking up her 73-month-old infant in his car seat.  She describes sharp pain from her mid sternum to her epigastric region which is better at rest and worsened with certain positional changes, particularly when she sits up from a supine position or when she abducts her arms towards the midline.  Her pain is reproducible.  She denies shortness of breath, palpitations, abdominal pain or vomiting, although she does endorse having mild nausea initially when the pain began.  There is no radiation of pain into her back.  She has had no treatment for her symptoms prior to arrival, her symptoms started around 8 PM today.  Pt is not breastfeeding.  HPI     Past Medical History:  Diagnosis Date  . Hypertension     There are no problems to display for this patient.   Past Surgical History:  Procedure Laterality Date  . LEG SURGERY     left leg     OB History    Gravida  3   Para  2   Term  2   Preterm  0   AB  0   Living  2     SAB      IAB      Ectopic      Multiple      Live Births              History reviewed. No pertinent family history.  Social History   Tobacco Use  . Smoking status: Never Smoker  . Smokeless tobacco: Never Used  Vaping Use  . Vaping Use: Never used  Substance Use Topics  . Alcohol use: No  . Drug use: No    Home Medications Prior to Admission medications   Medication Sig Start Date End Date Taking? Authorizing Provider  ibuprofen (ADVIL) 600 MG tablet Take 1 tablet (600 mg total) by mouth every 6 (six) hours as needed. 06/30/20  Yes Paulanthony Gleaves, Raynelle Fanning, PA-C    Allergies    Lactose intolerance  (gi) and Penicillins  Review of Systems   Review of Systems  Constitutional: Negative for fever.  HENT: Negative for congestion and sore throat.   Eyes: Negative.   Respiratory: Negative for chest tightness and shortness of breath.   Cardiovascular: Positive for chest pain. Negative for palpitations.  Gastrointestinal: Positive for nausea. Negative for abdominal pain, diarrhea and vomiting.  Genitourinary: Negative.   Musculoskeletal: Negative for arthralgias, joint swelling and neck pain.  Skin: Negative.  Negative for rash and wound.  Neurological: Negative for dizziness, weakness, light-headedness, numbness and headaches.  Psychiatric/Behavioral: Negative.     Physical Exam Updated Vital Signs BP (!) 130/96 (BP Location: Left Arm)   Pulse (!) 59   Temp 98 F (36.7 C) (Oral)   Resp (!) 26   Ht 5\' 8"  (1.727 m)   Wt 121.1 kg   SpO2 99%   BMI 40.60 kg/m   Physical Exam Vitals and nursing note reviewed.  Constitutional:      Appearance: She is well-developed.  HENT:     Head: Normocephalic and atraumatic.  Eyes:     Conjunctiva/sclera: Conjunctivae  normal.  Cardiovascular:     Rate and Rhythm: Normal rate and regular rhythm.     Heart sounds: Normal heart sounds.  Pulmonary:     Effort: Pulmonary effort is normal.     Breath sounds: Normal breath sounds. No wheezing, rhonchi or rales.  Chest:     Chest wall: Tenderness present.       Comments: Tender to palpation from her mid sternum to her xiphoid, reproducible.  No palpable deformity or crepitus. Abdominal:     General: Bowel sounds are normal.     Palpations: Abdomen is soft.     Tenderness: There is no abdominal tenderness. There is no guarding or rebound. Negative signs include Murphy's sign.  Musculoskeletal:        General: Normal range of motion.     Cervical back: Normal range of motion.  Skin:    General: Skin is warm and dry.  Neurological:     Mental Status: She is alert.     ED Results /  Procedures / Treatments   Labs (all labs ordered are listed, but only abnormal results are displayed) Labs Reviewed  BASIC METABOLIC PANEL - Abnormal; Notable for the following components:      Result Value   Calcium 8.4 (*)    All other components within normal limits  CBC - Abnormal; Notable for the following components:   Hemoglobin 11.8 (*)    RDW 15.9 (*)    All other components within normal limits  HCG, SERUM, QUALITATIVE  TROPONIN I (HIGH SENSITIVITY)    EKG None ED ECG REPORT   Date: 06/30/2020  Rate: 75  Rhythm: sinus arrhythmia  QRS Axis: normal  Intervals: normal  ST/T Wave abnormalities: normal  Conduction Disutrbances:none  Narrative Interpretation:   Old EKG Reviewed: none available  I have personally reviewed the EKG tracing and agree with the computerized printout as noted.  Radiology No results found. Imaging reviewed and negtive for acute findings. Procedures Procedures   Medications Ordered in ED Medications  ketorolac (TORADOL) 30 MG/ML injection 15 mg (15 mg Intravenous Given 06/30/20 2311)    ED Course  I have reviewed the triage vital signs and the nursing notes.  Pertinent labs & imaging results that were available during my care of the patient were reviewed by me and considered in my medical decision making (see chart for details).    MDM Rules/Calculators/A&P                          History, exam and work-up today strongly suggesting chest wall strain.  Patient was put on ibuprofen, discussed ice and heat therapy and follow-up with her PCP for any persistent or worsening symptoms.  There is no evidence for CAD, she has no shortness of breath, there is also no abdominal pain, specifically negative Murphy sign, doubt this is an atypical presentation of gallbladder disease. Final Clinical Impression(s) / ED Diagnoses Final diagnoses:  Strain of chest wall, initial encounter    Rx / DC Orders ED Discharge Orders         Ordered     ibuprofen (ADVIL) 600 MG tablet  Every 6 hours PRN        06/30/20 2327           Burgess Amor, PA-C 06/30/20 2332    Bethann Berkshire, MD 07/01/20 2320

## 2021-02-25 ENCOUNTER — Other Ambulatory Visit: Payer: Self-pay

## 2021-02-25 ENCOUNTER — Encounter (HOSPITAL_COMMUNITY): Payer: Self-pay

## 2021-02-25 ENCOUNTER — Emergency Department (HOSPITAL_COMMUNITY)
Admission: EM | Admit: 2021-02-25 | Discharge: 2021-02-25 | Disposition: A | Discharge status: Home / Self Care | Payer: Medicaid Other | Attending: Emergency Medicine | Admitting: Emergency Medicine

## 2021-02-25 DIAGNOSIS — R109 Unspecified abdominal pain: Secondary | ICD-10-CM | POA: Insufficient documentation

## 2021-02-25 DIAGNOSIS — M545 Low back pain, unspecified: Secondary | ICD-10-CM | POA: Insufficient documentation

## 2021-02-25 LAB — URINALYSIS, ROUTINE W REFLEX MICROSCOPIC
Bilirubin Urine: NEGATIVE
Glucose, UA: NEGATIVE mg/dL
Ketones, ur: NEGATIVE mg/dL
Leukocytes,Ua: NEGATIVE
Nitrite: NEGATIVE
Protein, ur: NEGATIVE mg/dL
Specific Gravity, Urine: >1.03 — ABNORMAL HIGH (ref 1.005–1.030)
pH: 5.5 (ref 5.0–8.0)

## 2021-02-25 LAB — URINALYSIS, MICROSCOPIC (REFLEX)
Bacteria, UA: NONE SEEN
Squamous Epithelial / HPF: NONE SEEN (ref 0–5)
WBC, UA: NONE SEEN WBC/hpf (ref 0–5)

## 2021-02-25 LAB — PREGNANCY, URINE: Preg Test, Ur: NEGATIVE

## 2021-02-25 NOTE — Discharge Instructions (Signed)
You have been seen here for back pain, I recommend taking over-the-counter pain medications like ibuprofen and/or Tylenol every 6 as needed.  Please follow dosage and on the back of bottle.  I also recommend applying heat to the area and stretching out the muscles as this will help decrease stiffness and pain.  I have given you information on exercises please follow.  Please follow up your PCP in a weeks time if symptoms not fully resolved  Come back to the emergency department if you develop chest pain, shortness of breath, severe abdominal pain, uncontrolled nausea, vomiting, diarrhea.

## 2021-02-25 NOTE — ED Provider Notes (Signed)
[facility] EMERGENCY DEPARTMENT Provider Note   CSN: [id number] Arrival date & time: 02/25/21  0920     History  Chief Complaint  Patient presents with   Flank Pain    Nancy Morrison is a 36 y.o. female.  HPI  Patient's without significant medical history presents with complaints of left-sided lower back pain.  This started about 3 days ago, came on suddenly, dull-like sensation, does not radiate, is intermittent, worsened with movements improved with rest, denies any paresthesias or weakness moving down her leg, no saddle paresthesias no urinary incontinency, retention, difficulty with bowel movements, she denies  vaginal discharge, vaginal bleeding, no pelvic pain, no history of ovarian cysts or ovarian torsion's, she states her last menstrual cycle was last month she is approximately 6 days late currently not on birth control.  She notes that she works at [employer] and  unclear whether or not she pulled something as she is walking, she is never had this complaint in the past she has had kidney stones but this feels nothing like her kidney stones.  Home Medications Prior to Admission medications   Medication Sig Start Date End Date Taking? Authorizing Provider  ibuprofen (ADVIL) 600 MG tablet Take 1 tablet (600 mg total) by mouth every 6 (six) hours as needed. Patient not taking: Reported on 02/25/2021 06/30/20   Evalee Jefferson, PA-C      Allergies    Lactose intolerance (gi) and Penicillins    Review of Systems   Review of Systems  Constitutional:  Negative for chills and fever.  Respiratory:  Negative for shortness of breath.   Cardiovascular:  Negative for chest pain.  Gastrointestinal:  Negative for abdominal pain.  Genitourinary:  Positive for flank pain and menstrual problem. Negative for decreased urine volume, difficulty urinating, dysuria, pelvic pain, vaginal bleeding, vaginal discharge and vaginal pain.  Musculoskeletal:  Positive for back pain.  Neurological:  Negative  for headaches.   Physical Exam Updated Vital Signs BP 126/71    Pulse 79    Temp 98.5 F (36.9 C) (Oral)    Resp 16    Ht 5\' 8"  (1.727 m)    Wt 121.1 kg    SpO2 100%    BMI 40.59 kg/m  Physical Exam Vitals and nursing note reviewed.  Constitutional:      General: She is not in acute distress.    Appearance: She is not ill-appearing.  HENT:     Head: Normocephalic and atraumatic.     Nose: No congestion.  Eyes:     Conjunctiva/sclera: Conjunctivae normal.  Cardiovascular:     Rate and Rhythm: Normal rate and regular rhythm.     Pulses: Normal pulses.     Heart sounds: No murmur heard.   No friction rub. No gallop.  Pulmonary:     Effort: No respiratory distress.     Breath sounds: No wheezing, rhonchi or rales.  Abdominal:     Palpations: Abdomen is soft.     Tenderness: There is no abdominal tenderness. There is no right CVA tenderness or left CVA tenderness.  Musculoskeletal:     Comments: Spine was palpated nontender to palpation, no step-off deformities noted, she was notably tender within the musculature surrounding the right iliac crest, she had positive straight leg raise on the right side, she had full range of motion lower extremities neurovascular is fully intact.  Skin:    General: Skin is warm and dry.  Neurological:     Mental Status: She is  alert.  Psychiatric:        Mood and Affect: Mood normal.    ED Results / Procedures / Treatments   Labs (all labs ordered are listed, but only abnormal results are displayed) Labs Reviewed  URINALYSIS, ROUTINE W REFLEX MICROSCOPIC - Abnormal; Notable for the following components:      Result Value   Specific Gravity, Urine >1.030 (*)    Hgb urine dipstick TRACE (*)    All other components within normal limits  PREGNANCY, URINE  URINALYSIS, MICROSCOPIC (REFLEX)    EKG None  Radiology No results found.  Procedures Procedures    Medications Ordered in ED Medications - No data to display  ED Course/ Medical  Decision Making/ A&P                           Medical Decision Making Amount and/or Complexity of Data Reviewed Labs: ordered.  This patient presents to the ED for concern of flank tenderness, this involves an extensive number of treatment options, and is a complaint that carries with it a high risk of complications and morbidity.  The differential diagnosis includes UTI, Pilo, kidney stone, muscular strain    Additional history obtained:  Additional history obtained from electronic medical record    Co morbidities that complicate the patient evaluation  N/A  Social Determinants of Health:  N/A    Lab Tests:  I Ordered, and personally interpreted labs.  The pertinent results include: UA unremarkable, urine pregnancy negative   Imaging Studies ordered:  I ordered imaging studies including N/A   Rule out Low suspicion for ectopic pregnancy as urine pregnancy is negative.  Low suspicion for ovarian torsion she is having no pelvic tenderness, pain is very positional, presentation atypical etiology.  Low suspicion for UTI, Pilo, kidney stone she is having no urinary symptoms, UA is negative for signs infection or hematuria. I have low suspicion for spinal fracture or spinal cord abnormality as patient denies urinary incontinency, retention, difficulty with bowel movements, denies saddle paresthesias.  Spine was palpated there is no step-off, crepitus or gross deformities felt, patient had 5/5 strength, full range of motion, neurovascular fully intact in the lower extremities, imaging will be deferred as there is no trauma associated this injury very low suspicion for fractures at this time.  Low suspicion for AAA presentation atypical etiology she has low risk factors, pain is very positional and reproducible. Low suspicion for septic arthritis as patient denies IV drug use, skin exam was performed no erythematous, edema or warm joints noted.     Dispostion and problem  list  After consideration of the diagnostic results and the patients response to treatment, I feel that the patent would benefit from   Back pain-likely muscular in nature, recommend over-the-counter pain medications, follow-up with PCP for further evaluation strict return precautions.             Final Clinical Impression(s) / ED Diagnoses Final diagnoses:  Acute right-sided low back pain without sciatica    Rx / DC Orders ED Discharge Orders     None         Marcello Fennel, PA-C 02/25/21 1127    Godfrey Pick, MD 02/27/21 [phone]

## 2021-02-25 NOTE — ED Triage Notes (Addendum)
Patient complaining of right flank pain x3 days. Denies any other symptoms or injuries. Period is 6 days late.

## 2021-03-21 ENCOUNTER — Other Ambulatory Visit: Payer: Self-pay

## 2021-03-21 ENCOUNTER — Emergency Department (HOSPITAL_COMMUNITY)
Admission: EM | Admit: 2021-03-21 | Discharge: 2021-03-21 | Disposition: A | Discharge status: Home / Self Care | Payer: Medicaid Other | Attending: Emergency Medicine | Admitting: Emergency Medicine

## 2021-03-21 ENCOUNTER — Emergency Department (HOSPITAL_COMMUNITY): Payer: Medicaid Other

## 2021-03-21 ENCOUNTER — Encounter (HOSPITAL_COMMUNITY): Payer: Self-pay

## 2021-03-21 DIAGNOSIS — W010XXA Fall on same level from slipping, tripping and stumbling without subsequent striking against object, initial encounter: Secondary | ICD-10-CM | POA: Insufficient documentation

## 2021-03-21 DIAGNOSIS — M25511 Pain in right shoulder: Secondary | ICD-10-CM | POA: Diagnosis present

## 2021-03-21 LAB — POCT PREGNANCY, URINE: Preg Test, Ur: NEGATIVE

## 2021-03-21 NOTE — ED Triage Notes (Signed)
Pt says she tripped at work Tuesday night and c/o pain in  arm.  Says went to an athletic trainer at her work and he taped up the sore area.  Pt says pain is no better.

## 2021-03-21 NOTE — ED Provider Notes (Signed)
[facility] EMERGENCY DEPARTMENT Provider Note   CSN: [id number] Arrival date & time: 03/21/21  1510     History  Chief Complaint  Patient presents with   Arm Pain    Nancy Morrison is a 36 y.o. female who complains of right shoulder pain after mechanical fall 2 days prior.  Patient states that she tripped and tried to catch herself on a railing and then fell to the floor.  Unsure of how she landed to cause her right shoulder pain.  She states that there is an Event organiser at work who taped up the shoulder with KT tape.  Patient continues to have significant pain on shoulder abduction or flexion.  Denies numbness or tingling.  No other complaints.   Arm Pain      Home Medications Prior to Admission medications   Medication Sig Start Date End Date Taking? Authorizing Provider  ibuprofen (ADVIL) 600 MG tablet Take 1 tablet (600 mg total) by mouth every 6 (six) hours as needed. Patient not taking: Reported on 02/25/2021 06/30/20   Burgess Amor, PA-C      Allergies    Lactose intolerance (gi) and Penicillins    Review of Systems   Review of Systems  Physical Exam Updated Vital Signs BP 130/90    Pulse 60    Temp 97.8 F (36.6 C) (Oral)    Resp 20    Ht 5\' 8"  (1.727 m)    Wt 120.7 kg    LMP 02/23/2021 (Approximate)    SpO2 100%    Breastfeeding No    BMI 40.45 kg/m  Physical Exam Vitals and nursing note reviewed.  Constitutional:      General: She is not in acute distress.    Appearance: She is not ill-appearing.  HENT:     Head: Atraumatic.  Eyes:     Conjunctiva/sclera: Conjunctivae normal.  Cardiovascular:     Rate and Rhythm: Normal rate and regular rhythm.     Pulses: Normal pulses.     Heart sounds: No murmur heard. Pulmonary:     Effort: Pulmonary effort is normal. No respiratory distress.     Breath sounds: Normal breath sounds.  Abdominal:     General: Abdomen is flat. There is no distension.     Palpations: Abdomen is soft.     Tenderness: There is no  abdominal tenderness.  Musculoskeletal:     Cervical back: Normal range of motion.     Comments: Right shoulder without palpable deformity, bruising or crepitus.  Passive and active range of motion limited on shoulder abduction and flexion.  Neurovascularly intact  Skin:    General: Skin is warm and dry.     Capillary Refill: Capillary refill takes less than 2 seconds.  Neurological:     General: No focal deficit present.     Mental Status: She is alert.  Psychiatric:        Mood and Affect: Mood normal.    ED Results / Procedures / Treatments   Labs (all labs ordered are listed, but only abnormal results are displayed) Labs Reviewed  POC URINE PREG, ED  POCT PREGNANCY, URINE    EKG None  Radiology DG Shoulder Right  Result Date: 03/21/2021 CLINICAL DATA:  Fall.  Shoulder pain EXAM: RIGHT SHOULDER - 2+ VIEW COMPARISON:  None. FINDINGS: There is no evidence of fracture or dislocation. There is no evidence of arthropathy or other focal bone abnormality. Soft tissues are unremarkable. IMPRESSION: Negative. Electronically Signed   By:  Marlan Palau M.D.   On: 03/21/2021 16:09    Procedures Procedures   Medications Ordered in ED Medications - No data to display  ED Course/ Medical Decision Making/ A&P Clinical Course as of 03/21/21 2359  Thu Mar 21, 2021  1757 DG Shoulder Right [EC]    Clinical Course User Index [EC] Janell Quiet, PA-C                           Medical Decision Making Amount and/or Complexity of Data Reviewed Radiology: ordered. Decision-making details documented in ED Course.   History:  Per HPI Social determinants of health: none  Initial impression:  This patient presents to the ED for concern of right shoulder injury, this involves an extensive number of treatment options, and is a complaint that carries with it a high risk of complications and morbidity.   Differentials include fracture, dislocation, tendon rupture, contusion  Imaging  Studies ordered:  I ordered imaging studies including  Right shoulder x-ray without dislocation or fracture I independently visualized and interpreted imaging and I agree with the radiologist interpretation.    Disposition:  After consideration of the diagnostic results, physical exam, history and the patients response to treatment feel that the patent would benefit from discharge with outpatient follow-up.   Right shoulder pain: Patient's x-ray was negative for fracture or dislocation.  However given her physical exam findings and mechanism of injury, there is still lingering suspicion of a rotator cuff tear.  I have given patient a outpatient referral for an orthopedic physician, and if symptoms do not improve in a week or 2, she is to follow-up for possible MRI.  Sling was provided.  RICE protocol indicated.  All questions were asked and answered and she was discharged home in good condition.   Final Clinical Impression(s) / ED Diagnoses Final diagnoses:  Acute pain of right shoulder    Rx / DC Orders ED Discharge Orders     None         Delight Ovens 03/22/21 0003    Bethann Berkshire, MD 03/22/21 [phone]

## 2021-03-21 NOTE — Discharge Instructions (Signed)
Your x-ray today was negative for fracture or dislocation, however with the injury and pain you are describing I am concerned that you may be tore a tendon in the rotator cuff.  I have provided you an orthopedic referral for you to follow-up with outpatient as you may need an MRI for further evaluation.  Have given you a sling here in the ED which should provide some comfort.  Continue alternating Tylenol and Motrin for your pain and ice the shoulder for about 15 to 20 minutes at a time as often as you can.

## 2021-03-22 ENCOUNTER — Telehealth: Payer: Self-pay | Admitting: Orthopedic Surgery

## 2021-03-22 NOTE — Telephone Encounter (Signed)
Call from patient following emergency room visit at [facility] last night for the following, which she states is due to a  work-related injury, which patient has reported to employer :(per chart note)  "Right shoulder pain: Patient's x-ray was negative for fracture or dislocation.  However given her physical exam findings and mechanism of injury, there is still lingering suspicion of a rotator cuff tear" -discussed protocol for worker's comp. Patient aware to request employer [employer] or worker's comp insurer to contact us directly to schedule. Appointment pending worker's comp approval/authorization.

## 2021-05-04 ENCOUNTER — Other Ambulatory Visit: Payer: Self-pay

## 2021-05-04 ENCOUNTER — Emergency Department (HOSPITAL_COMMUNITY)
Admission: EM | Admit: 2021-05-04 | Discharge: 2021-05-05 | Disposition: A | Discharge status: Home / Self Care | Payer: Managed Care, Other (non HMO) | Attending: Emergency Medicine | Admitting: Emergency Medicine

## 2021-05-04 ENCOUNTER — Encounter (HOSPITAL_COMMUNITY): Payer: Self-pay

## 2021-05-04 DIAGNOSIS — R110 Nausea: Secondary | ICD-10-CM | POA: Diagnosis present

## 2021-05-04 DIAGNOSIS — N390 Urinary tract infection, site not specified: Secondary | ICD-10-CM | POA: Insufficient documentation

## 2021-05-04 MED ORDER — ONDANSETRON 8 MG PO TBDP
8.0000 mg | ORAL_TABLET | Freq: Once | ORAL | Status: AC
  Administered 2021-05-04: 8 mg via ORAL
  Filled 2021-05-04: qty 1

## 2021-05-04 NOTE — ED Triage Notes (Signed)
Pt c/o nausea, dizziness, and headache. Denies any emesis. States she has not taken any medication for her symptoms. ?

## 2021-05-05 DIAGNOSIS — N390 Urinary tract infection, site not specified: Secondary | ICD-10-CM | POA: Diagnosis not present

## 2021-05-05 LAB — URINALYSIS, ROUTINE W REFLEX MICROSCOPIC
Bilirubin Urine: NEGATIVE
Glucose, UA: NEGATIVE mg/dL
Ketones, ur: NEGATIVE mg/dL
Nitrite: POSITIVE — AB
Protein, ur: 30 mg/dL — AB
Specific Gravity, Urine: 1.02 (ref 1.005–1.030)
pH: 7.5 (ref 5.0–8.0)

## 2021-05-05 LAB — PREGNANCY, URINE: Preg Test, Ur: NEGATIVE

## 2021-05-05 LAB — URINALYSIS, MICROSCOPIC (REFLEX)

## 2021-05-05 MED ORDER — NITROFURANTOIN MONOHYD MACRO 100 MG PO CAPS: 100.0000 mg | ORAL_CAPSULE | Freq: Two times a day (BID) | ORAL | 0 refills | Status: DC

## 2021-05-05 MED ORDER — NITROFURANTOIN MACROCRYSTAL 100 MG PO CAPS
100.0000 mg | ORAL_CAPSULE | Freq: Once | ORAL | Status: AC
  Administered 2021-05-05: 100 mg via ORAL
  Filled 2021-05-05: qty 1

## 2021-05-05 MED ORDER — ONDANSETRON 4 MG PO TBDP: ORAL_TABLET | ORAL | 0 refills | Status: DC

## 2021-05-05 NOTE — ED Provider Notes (Signed)
?[facility] EMERGENCY DEPARTMENT ?Provider Note ? ? ?CSN: [id number] ?Arrival date & time: 05/04/21  2153 ? ?  ? ?History ? ?Chief Complaint  ?Patient presents with  ? Nausea  ? ? ?Nancy Morrison is a 36 y.o. female. ? ?36 year old female that presents to the ER secondary to dizziness, nausea and a headache.  No vomiting.  No back pain or GI symptoms otherwise.  Patient states that she has increased urination recently with these because of increased intake.  No dysuria.  No other associated symptoms.  ? ? ? ?  ? ?Home Medications ?Prior to Admission medications   ?Medication Sig Start Date End Date Taking? Authorizing Provider  ?nitrofurantoin, macrocrystal-monohydrate, (MACROBID) 100 MG capsule Take 1 capsule (100 mg total) by mouth 2 (two) times daily. 05/05/21  Yes Nnamdi Dacus, Barbara Cower, MD  ?ondansetron (ZOFRAN-ODT) 4 MG disintegrating tablet 4mg  ODT q4 hours prn nausea/vomit 05/05/21  Yes Haroon Shatto, 07/05/21, MD  ?ibuprofen (ADVIL) 600 MG tablet Take 1 tablet (600 mg total) by mouth every 6 (six) hours as needed. ?Patient not taking: Reported on 02/25/2021 06/30/20   08/30/20, PA-C  ?   ? ?Allergies    ?Lactose intolerance (gi) and Penicillins   ? ?Review of Systems   ?Review of Systems ? ?Physical Exam ?Updated Vital Signs ?BP 124/83   Pulse 62   Temp 98 ?F (36.7 ?C) (Oral)   Resp 18   Ht 5\' 8"  (1.727 m)   Wt 122.5 kg   LMP 05/04/2021 (Approximate) Comment: States she was 8 days late but bled today for about 4 hours.  SpO2 100%   BMI 41.05 kg/m?  ?Physical Exam ?Vitals and nursing note reviewed.  ?Constitutional:   ?   Appearance: She is well-developed.  ?HENT:  ?   Head: Normocephalic and atraumatic.  ?   Mouth/Throat:  ?   Mouth: Mucous membranes are moist.  ?   Pharynx: Oropharynx is clear.  ?Eyes:  ?   Pupils: Pupils are equal, round, and reactive to light.  ?Cardiovascular:  ?   Rate and Rhythm: Normal rate and regular rhythm.  ?Pulmonary:  ?   Effort: No respiratory distress.  ?   Breath sounds: No stridor.   ?Abdominal:  ?   General: Abdomen is flat. There is no distension.  ?   Tenderness: There is no abdominal tenderness. There is no guarding.  ?   Hernia: No hernia is present.  ?Musculoskeletal:     ?   General: No swelling, tenderness or deformity.  ?   Cervical back: Normal range of motion.  ?Skin: ?   General: Skin is warm and dry.  ?Neurological:  ?   General: No focal deficit present.  ?   Mental Status: She is alert.  ? ? ?ED Results / Procedures / Treatments   ?Labs ?(all labs ordered are listed, but only abnormal results are displayed) ?Labs Reviewed  ?URINALYSIS, ROUTINE W REFLEX MICROSCOPIC - Abnormal; Notable for the following components:  ?    Result Value  ? Hgb urine dipstick LARGE (*)   ? Protein, ur 30 (*)   ? Nitrite POSITIVE (*)   ? Leukocytes,Ua SMALL (*)   ? All other components within normal limits  ?URINALYSIS, MICROSCOPIC (REFLEX) - Abnormal; Notable for the following components:  ? Bacteria, UA FEW (*)   ? All other components within normal limits  ?URINE CULTURE  ?PREGNANCY, URINE  ? ? ?EKG ?None ? ?Radiology ?No results found. ? ?Procedures ?Procedures  ? ? ?  Medications Ordered in ED ?Medications  ?ondansetron (ZOFRAN-ODT) disintegrating tablet 8 mg (8 mg Oral Given 05/04/21 2337)  ?nitrofurantoin (MACRODANTIN) capsule 100 mg (100 mg Oral Given 05/05/21 0130)  ? ? ?ED Course/ Medical Decision Making/ A&P ?  ?                        ?Medical Decision Making ?Amount and/or Complexity of Data Reviewed ?Labs: ordered. ? ?Risk ?Prescription drug management. ? ? ? ?Follow the UTI.  We will start antibiotics.  Culture sent.  Patient symptomatically feels better with meds in the ER.  No indication for further work-up low suspicion for pyelonephritis. ? ? ?Final Clinical Impression(s) / ED Diagnoses ?Final diagnoses:  ?Urinary tract infection without hematuria, site unspecified  ? ? ?Rx / DC Orders ?ED Discharge Orders   ? ?      Ordered  ?  nitrofurantoin, macrocrystal-monohydrate, (MACROBID) 100 MG  capsule  2 times daily       ? 05/05/21 0102  ?  ondansetron (ZOFRAN-ODT) 4 MG disintegrating tablet       ? 05/05/21 0102  ? ?  ?  ? ?  ? ? ?  ?Marily Memos, MD ?05/05/21 0236 ? ?

## 2021-05-08 LAB — URINE CULTURE: Culture: >=100000 — AB

## 2021-05-09 ENCOUNTER — Telehealth: Payer: Self-pay

## 2021-05-09 NOTE — Telephone Encounter (Signed)
Post ED Visit - Positive Culture Follow-up ? ?Culture report reviewed by antimicrobial stewardship pharmacist: ?[facility] ?[x]  , Pharm.D. ?[]  Enos Fling, Pharm.D., BCPS AQ-ID ?[]  , Pharm.D., BCPS ?[]  Celedonio Miyamoto, .D., BCPS ?[]  [place], .D., BCPS, AAHIVP ?[]  Georgina Pillion, Pharm.D., BCPS, AAHIVP ?[]  [address], PharmD, BCPS ?[]  , PharmD, BCPS ?[]  [place], PharmD, BCPS ?[]  [address], PharmD ?[]  , PharmD, BCPS ?[]  Estella Husk, PharmD ? ? Pharmacy Team ?[]  Lysle Pearl, PharmD ?[]  , PharmD ?[]  Phillips Climes, PharmD ?[]  , Rph ?[]  Agapito Games) , PharmD ?[]  Verlan Friends, PharmD ?[]  , PharmD ?[]  Mervyn Gay, PharmD ?[]  , PharmD ?[]  Vinnie Level, PharmD ?[]  [facility], PharmD ?[]  , PharmD ?[]  Len Childs, PharmD ? ? ?Positive urine culture ?Treated with Nitrofantoin Monohyd Macro, organism intermediate to the same. Unclean sample with negative UA, no action needed and no further patient follow-up is required at this time. ? ? ?05/09/2021, 11:53 AM ?  ?

## 2021-06-06 ENCOUNTER — Emergency Department (HOSPITAL_COMMUNITY)
Admission: EM | Admit: 2021-06-06 | Discharge: 2021-06-06 | Disposition: A | Discharge status: Home / Self Care | Payer: Managed Care, Other (non HMO) | Attending: Emergency Medicine | Admitting: Emergency Medicine

## 2021-06-06 ENCOUNTER — Encounter (HOSPITAL_COMMUNITY): Payer: Self-pay | Admitting: *Deleted

## 2021-06-06 ENCOUNTER — Emergency Department (HOSPITAL_COMMUNITY): Payer: Managed Care, Other (non HMO)

## 2021-06-06 ENCOUNTER — Other Ambulatory Visit: Payer: Self-pay

## 2021-06-06 DIAGNOSIS — Z79899 Other long term (current) drug therapy: Secondary | ICD-10-CM | POA: Diagnosis not present

## 2021-06-06 DIAGNOSIS — R1030 Lower abdominal pain, unspecified: Secondary | ICD-10-CM | POA: Diagnosis present

## 2021-06-06 DIAGNOSIS — I10 Essential (primary) hypertension: Secondary | ICD-10-CM | POA: Insufficient documentation

## 2021-06-06 DIAGNOSIS — N926 Irregular menstruation, unspecified: Secondary | ICD-10-CM | POA: Diagnosis not present

## 2021-06-06 DIAGNOSIS — R112 Nausea with vomiting, unspecified: Secondary | ICD-10-CM | POA: Diagnosis not present

## 2021-06-06 LAB — CBC WITH DIFFERENTIAL/PLATELET
Abs Immature Granulocytes: 0.02 10*3/uL (ref 0.00–0.07)
Basophils Absolute: 0.1 10*3/uL (ref 0.0–0.1)
Basophils Relative: 1 %
Eosinophils Absolute: 0.1 10*3/uL (ref 0.0–0.5)
Eosinophils Relative: 2 %
HCT: 38 % (ref 36.0–46.0)
Hemoglobin: 12.1 g/dL (ref 12.0–15.0)
Immature Granulocytes: 0 %
Lymphocytes Relative: 18 %
Lymphs Abs: 1.5 10*3/uL (ref 0.7–4.0)
MCH: 27.8 pg (ref 26.0–34.0)
MCHC: 31.8 g/dL (ref 30.0–36.0)
MCV: 87.4 fL (ref 80.0–100.0)
Monocytes Absolute: 0.7 10*3/uL (ref 0.1–1.0)
Monocytes Relative: 9 %
Neutro Abs: 5.7 10*3/uL (ref 1.7–7.7)
Neutrophils Relative %: 70 %
Platelets: 331 10*3/uL (ref 150–400)
RBC: 4.35 MIL/uL (ref 3.87–5.11)
RDW: 15.5 % (ref 11.5–15.5)
WBC: 8.2 10*3/uL (ref 4.0–10.5)
nRBC: 0 % (ref 0.0–0.2)

## 2021-06-06 LAB — COMPREHENSIVE METABOLIC PANEL
ALT: 20 U/L (ref 0–44)
AST: 32 U/L (ref 15–41)
Albumin: 3.8 g/dL (ref 3.5–5.0)
Alkaline Phosphatase: 60 U/L (ref 38–126)
Anion gap: 6 (ref 5–15)
BUN: 9 mg/dL (ref 6–20)
CO2: 26 mmol/L (ref 22–32)
Calcium: 8.5 mg/dL — ABNORMAL LOW (ref 8.9–10.3)
Chloride: 105 mmol/L (ref 98–111)
Creatinine, Ser: 0.69 mg/dL (ref 0.44–1.00)
GFR, Estimated: >60 mL/min (ref >60)
Glucose, Bld: 106 mg/dL — ABNORMAL HIGH (ref 70–99)
Potassium: 4.1 mmol/L (ref 3.5–5.1)
Sodium: 137 mmol/L (ref 135–145)
Total Bilirubin: 0.6 mg/dL (ref 0.3–1.2)
Total Protein: 7 g/dL (ref 6.5–8.1)

## 2021-06-06 LAB — URINALYSIS, ROUTINE W REFLEX MICROSCOPIC
Bacteria, UA: NONE SEEN
Bilirubin Urine: NEGATIVE
Glucose, UA: NEGATIVE mg/dL
Ketones, ur: NEGATIVE mg/dL
Leukocytes,Ua: NEGATIVE
Nitrite: NEGATIVE
Protein, ur: NEGATIVE mg/dL
Specific Gravity, Urine: 1.014 (ref 1.005–1.030)
pH: 5 (ref 5.0–8.0)

## 2021-06-06 LAB — PREGNANCY, URINE: Preg Test, Ur: NEGATIVE

## 2021-06-06 MED ORDER — IOHEXOL 300 MG/ML  SOLN
100.0000 mL | Freq: Once | INTRAMUSCULAR | Status: AC | PRN
  Administered 2021-06-06: 100 mL via INTRAVENOUS

## 2021-06-06 MED ORDER — ONDANSETRON 4 MG PO TBDP
4.0000 mg | ORAL_TABLET | Freq: Once | ORAL | Status: AC
  Administered 2021-06-06: 4 mg via ORAL
  Filled 2021-06-06: qty 1

## 2021-06-06 NOTE — Discharge Instructions (Signed)
Pregnancy test negative CT scan abdomen pelvis without any acute findings.  Most likely secondary to irregular menses.  Make an appointment to follow-up with your OB/GYN in [place].  Return for any new or worse symptoms vaginal discharge heavy vaginal bleeding.  Worse abdominal pain.  Fevers. ?

## 2021-06-06 NOTE — ED Provider Notes (Signed)
?[facility] EMERGENCY DEPARTMENT ?Provider Note ? ? ?CSN: [id number] ?Arrival date & time: 06/06/21  [id number] ? ?  ? ?History ? ?Chief Complaint  ?Patient presents with  ? Abdominal Pain  ? ? ?Nancy ReichertClay B Ryder is a 36 y.o. female. ? ?Patient with a complaint of lower quadrant crampy abdominal pain since May 5.  Last menstrual period was April 8 that was bit late.  She had a little bit of spotting with this abdominal cramping.  Also today had nausea and vomiting.  The cramping is bilateral lower part of the abdomen.  Patient has been concerned she may be pregnant.  Patient had a delivery in February 2022.  Patient usually followed by OB/GYN in [place].  No blood in the vomit today.  No fevers no dysuria.  No discharge. ? ?Past medical history significant for hypertension.  Past surgical history noncontributory patient never smoked. ? ? ?  ? ?Home Medications ?Prior to Admission medications   ?Medication Sig Start Date End Date Taking? Authorizing Provider  ?ibuprofen (ADVIL) 600 MG tablet Take 1 tablet (600 mg total) by mouth every 6 (six) hours as needed. ?Patient not taking: Reported on 02/25/2021 06/30/20   Burgess AmorIdol, Julie, PA-C  ?nitrofurantoin, macrocrystal-monohydrate, (MACROBID) 100 MG capsule Take 1 capsule (100 mg total) by mouth 2 (two) times daily. ?Patient not taking: Reported on 06/06/2021 05/05/21   Mesner, Barbara CowerJason, MD  ?ondansetron (ZOFRAN-ODT) 4 MG disintegrating tablet 4mg  ODT q4 hours prn nausea/vomit ?Patient not taking: Reported on 06/06/2021 05/05/21   Mesner, Barbara CowerJason, MD  ?   ? ?Allergies    ?Lactose intolerance (gi) and Penicillins   ? ?Review of Systems   ?Review of Systems  ?Constitutional:  Negative for chills and fever.  ?HENT:  Negative for rhinorrhea and sore throat.   ?Eyes:  Negative for visual disturbance.  ?Respiratory:  Negative for cough and shortness of breath.   ?Cardiovascular:  Negative for chest pain and leg swelling.  ?Gastrointestinal:  Positive for abdominal pain, nausea and vomiting. Negative for  diarrhea.  ?Genitourinary:  Positive for vaginal bleeding. Negative for dysuria and vaginal discharge.  ?Musculoskeletal:  Negative for back pain and neck pain.  ?Skin:  Negative for rash.  ?Neurological:  Negative for dizziness, light-headedness and headaches.  ?Hematological:  Does not bruise/bleed easily.  ?Psychiatric/Behavioral:  Negative for confusion.   ? ?Physical Exam ?Updated Vital Signs ?BP 125/79   Pulse 64   Temp 98.2 ?F (36.8 ?C) (Oral)   Resp 16   Ht 1.74 m (5' 8.5")   Wt 117.9 kg   LMP 06/01/2021   SpO2 100%   BMI 38.96 kg/m?  ?Physical Exam ?Vitals and nursing note reviewed.  ?Constitutional:   ?   General: She is not in acute distress. ?   Appearance: She is well-developed. She is obese.  ?HENT:  ?   Head: Normocephalic and atraumatic.  ?Eyes:  ?   Conjunctiva/sclera: Conjunctivae normal.  ?Cardiovascular:  ?   Rate and Rhythm: Normal rate and regular rhythm.  ?   Heart sounds: No murmur heard. ?Pulmonary:  ?   Effort: Pulmonary effort is normal. No respiratory distress.  ?   Breath sounds: Normal breath sounds.  ?Abdominal:  ?   General: Bowel sounds are normal. There is no distension.  ?   Palpations: Abdomen is soft.  ?   Tenderness: There is no abdominal tenderness.  ?   Hernia: There is no hernia in the umbilical area.  ?Musculoskeletal:     ?  General: No swelling.  ?   Cervical back: Neck supple.  ?Skin: ?   General: Skin is warm and dry.  ?   Capillary Refill: Capillary refill takes less than 2 seconds.  ?Neurological:  ?   General: No focal deficit present.  ?   Mental Status: She is alert and oriented to person, place, and time.  ?Psychiatric:     ?   Mood and Affect: Mood normal.  ? ? ?ED Results / Procedures / Treatments   ?Labs ?(all labs ordered are listed, but only abnormal results are displayed) ?Labs Reviewed  ?COMPREHENSIVE METABOLIC PANEL - Abnormal; Notable for the following components:  ?    Result Value  ? Glucose, Bld 106 (*)   ? Calcium 8.5 (*)   ? All other  components within normal limits  ?URINALYSIS, ROUTINE W REFLEX MICROSCOPIC - Abnormal; Notable for the following components:  ? APPearance HAZY (*)   ? Hgb urine dipstick MODERATE (*)   ? All other components within normal limits  ?CBC WITH DIFFERENTIAL/PLATELET  ?PREGNANCY, URINE  ? ? ?EKG ?None ? ?Radiology ?CT Abdomen Pelvis W Contrast ? ?Result Date: 06/06/2021 ?CLINICAL DATA:  Lower abdominal cramping radiating to upper abdomen nausea, vomiting EXAM: CT ABDOMEN AND PELVIS WITH CONTRAST TECHNIQUE: Multidetector CT imaging of the abdomen and pelvis was performed using the standard protocol following bolus administration of intravenous contrast. RADIATION DOSE REDUCTION: This exam was performed according to the departmental dose-optimization program which includes automated exposure control, adjustment of the mA and/or kV according to patient size and/or use of iterative reconstruction technique. CONTRAST:  OMNIPAQUE IOHEXOL 300 MG/ML  SOLN COMPARISON:  CT abdomen/pelvis 12/05/2011 FINDINGS: Lower chest: The lung bases are clear. The imaged heart is unremarkable. Hepatobiliary: There is a subcentimeter hypodense lesion in the left hepatic lobe, too small to characterize but most likely reflects a small cyst for which no specific imaging follow-up is required. The liver is otherwise unremarkable. The gallbladder is unremarkable. There is no biliary ductal dilatation. Pancreas: Unremarkable. Spleen: Unremarkable. Adrenals/Urinary Tract: The adrenals are unremarkable. The kidneys are unremarkable, with no focal lesion, stone, hydronephrosis, or hydroureter. Bladder is unremarkable. Stomach/Bowel: The stomach is unremarkable. There is no evidence of bowel obstruction. There is no abnormal bowel wall thickening or inflammatory change. The appendix is normal. Vascular/Lymphatic: Abdominal aorta is normal in course and caliber. The major branch vessels appear patent. The main portal and splenic veins are patent.  There is a 1.2 cm lymph node along the lesser curvature of the stomach which is stable to decreased in size since 2013. There is no new or progressive lymphadenopathy in the abdomen or pelvis. Reproductive: The uterus and adnexa are unremarkable. Other: There is no ascites or free air. There is a fat containing umbilical hernia, unchanged. Musculoskeletal: There is no acute osseous abnormality or suspicious osseous lesion. IMPRESSION: No acute findings in the abdomen or pelvis to explain the patient's symptoms. Electronically Signed   By: Lesia Hausen M.D.   On: 06/06/2021 10:04   ? ?Procedures ?Procedures  ? ? ?Medications Ordered in ED ?Medications  ?ondansetron (ZOFRAN-ODT) disintegrating tablet 4 mg (4 mg Oral Given 06/06/21 0810)  ?iohexol (OMNIPAQUE) 300 MG/ML solution 100 mL (100 mLs Intravenous Contrast Given 06/06/21 0943)  ? ? ?ED Course/ Medical Decision Making/ A&P ?  ?                        ?Medical Decision Making ?Amount and/or Complexity  of Data Reviewed ?Labs: ordered. ?Radiology: ordered. ? ?Risk ?Prescription drug management. ? ? ?Patient's abdomen soft nontender patient is obese so the tenderness could be insulated some.  Patient is concerned about possible pregnancy. ? ?Will rule in or rule out pregnancy first and then proceed accordingly.  If pregnancy test is positive then will evaluate as possible concern for ectopic pregnancy.  If pregnancy test is negative.  Will get CT scan of the abdomen. ? ?Pregnancy test negative.  Repeat on abdominal exam patient without any tenderness.  CT scan of the abdomen shows no abnormalities. ? ?Patient is reassured that the pregnancy test was negative.  Think that was her main concern.  She is not concerned about STD.  Does not want pelvic exam.  Patient does not need work note.  She will follow-up for irregular menses with her OB/GYN in [place]. ? ? ?Final Clinical Impression(s) / ED Diagnoses ?Final diagnoses:  ?Lower abdominal pain  ?Irregular menses  ? ? ?Rx /  DC Orders ?ED Discharge Orders   ? ? None  ? ?  ? ? ?  ?Vanetta Mulders, MD ?06/06/21 1028 ? ?

## 2021-06-06 NOTE — ED Triage Notes (Signed)
Pt c/o lower abdominal cramping that radiates to upper abdomen since 5/5, constant since 0300 this morning. Pt also c/o n/v that started this morning. Pt also c/o worsening lower back pain.  ?

## 2021-10-08 ENCOUNTER — Encounter (HOSPITAL_COMMUNITY): Payer: Self-pay

## 2021-10-08 ENCOUNTER — Emergency Department (HOSPITAL_COMMUNITY)
Admission: EM | Admit: 2021-10-08 | Discharge: 2021-10-08 | Disposition: A | Discharge status: Home / Self Care | Payer: Managed Care, Other (non HMO) | Attending: Emergency Medicine | Admitting: Emergency Medicine

## 2021-10-08 ENCOUNTER — Other Ambulatory Visit: Payer: Self-pay

## 2021-10-08 DIAGNOSIS — L739 Follicular disorder, unspecified: Secondary | ICD-10-CM | POA: Diagnosis not present

## 2021-10-08 DIAGNOSIS — L02811 Cutaneous abscess of head [any part, except face]: Secondary | ICD-10-CM | POA: Diagnosis present

## 2021-10-08 MED ORDER — MUPIROCIN 2 % EX OINT: TOPICAL_OINTMENT | CUTANEOUS | 0 refills | Status: DC

## 2021-10-08 MED ORDER — DOXYCYCLINE HYCLATE 100 MG PO CAPS: 100.0000 mg | ORAL_CAPSULE | Freq: Two times a day (BID) | ORAL | 0 refills | Status: DC

## 2021-10-08 NOTE — Discharge Instructions (Signed)
Keep the area clean with mild soap and water.  Apply the ointment as directed.  Take the antibiotic as directed until finished.  Return to the emergency department for any new or worsening symptoms.

## 2021-10-08 NOTE — ED Triage Notes (Signed)
Pt presents to ED states she had something in her head that was draining yesterday. States it is sore today.

## 2021-10-11 NOTE — ED Provider Notes (Signed)
[facility] EMERGENCY DEPARTMENT Provider Note   CSN: [id number] Arrival date & time: 10/08/21  1242     History  Chief Complaint  Patient presents with   Abscess    Nancy Morrison is a 36 y.o. female.   Abscess Associated symptoms: no fever, no headaches, no nausea and no vomiting        Nancy Morrison is a 36 y.o. female who presents to the Emergency Department complaining of tenderness and drainage of the left scalp.  Noticed symptoms 1 day prior to arrival.  She states that she scratched something in her scalp and noticed that it was draining what she felt like was pus.  She describes having some soreness of the area.  Denies known injury or insect bite.  No fever or chills.  Home Medications Prior to Admission medications   Medication Sig Start Date End Date Taking? Authorizing Provider  doxycycline (VIBRAMYCIN) 100 MG capsule Take 1 capsule (100 mg total) by mouth 2 (two) times daily. 10/08/21  Yes Khloee Garza, PA-C  mupirocin ointment (BACTROBAN) 2 % Apply a small amount to the affected area 3 times a day for 7 days 10/08/21  Yes Chipper Koudelka, PA-C  ibuprofen (ADVIL) 600 MG tablet Take 1 tablet (600 mg total) by mouth every 6 (six) hours as needed. Patient not taking: Reported on 02/25/2021 06/30/20   Burgess Amor, PA-C  nitrofurantoin, macrocrystal-monohydrate, (MACROBID) 100 MG capsule Take 1 capsule (100 mg total) by mouth 2 (two) times daily. Patient not taking: Reported on 06/06/2021 05/05/21   Mesner, Barbara Cower, MD  ondansetron (ZOFRAN-ODT) 4 MG disintegrating tablet 4mg  ODT q4 hours prn nausea/vomit Patient not taking: Reported on 06/06/2021 05/05/21   Mesner, 07/05/21, MD      Allergies    Lactose intolerance (gi) and Penicillins    Review of Systems   Review of Systems  Constitutional:  Negative for appetite change and fever.  HENT:  Negative for congestion and facial swelling.   Respiratory:  Negative for cough and shortness of breath.   Gastrointestinal:   Negative for nausea and vomiting.  Musculoskeletal:  Negative for neck pain and neck stiffness.  Skin:  Positive for wound.  Neurological:  Negative for dizziness, weakness and headaches.    Physical Exam Updated Vital Signs BP (!) 135/95 (BP Location: Left Arm)   Pulse 77   Temp 98.7 F (37.1 C) (Oral)   Resp 18   Ht 5\' 9"  (1.753 m)   Wt 119.4 kg   LMP 10/01/2021   SpO2 97%   BMI 38.88 kg/m  Physical Exam Vitals and nursing note reviewed.  Constitutional:      General: She is not in acute distress.    Appearance: Normal appearance. She is not toxic-appearing.  HENT:     Head:     Comments: 1 cm excoriated pustule to the left parietal scalp.  No fluctuance or edema.  No purulent material expressed on exam. Cardiovascular:     Rate and Rhythm: Normal rate and regular rhythm.  Pulmonary:     Effort: Pulmonary effort is normal.  Musculoskeletal:        General: Normal range of motion.     Cervical back: Normal range of motion.  Lymphadenopathy:     Cervical: No cervical adenopathy.  Skin:    General: Skin is warm.     Capillary Refill: Capillary refill takes less than 2 seconds.  Neurological:     General: No focal deficit present.  Mental Status: She is alert.     Sensory: No sensory deficit.     Motor: No weakness.     ED Results / Procedures / Treatments   Labs (all labs ordered are listed, but only abnormal results are displayed) Labs Reviewed - No data to display  EKG None  Radiology No results found.  Procedures Procedures    Medications Ordered in ED Medications - No data to display  ED Course/ Medical Decision Making/ A&P                           Medical Decision Making Patient here for evaluation of possible abscess of the scalp.  No neck pain, fever or chills.  On exam, patient does have 1 cm open pustule.  There is no surrounding erythema or fluctuance and no further material expressed on exam.  Patient well-appearing, nontoxic.   Vital signs reviewed.  Mildly hypertensive with history of same.  Doubt emergent process, likely folliculitis.  No evidence of cellulitis, abscess, or necrotizing fasciitis.  Amount and/or Complexity of Data Reviewed Discussion of management or test interpretation with external provider(s): Patient well-appearing.  Folliculitis of the scalp.  Agreeable to treatment plan with antibiotics and warm compresses.  She will follow-up with PCP as needed.  Return precautions were discussed.  Appropriate for discharge home.  Risk Prescription drug management.           Final Clinical Impression(s) / ED Diagnoses Final diagnoses:  Folliculitis    Rx / DC Orders ED Discharge Orders          Ordered    mupirocin ointment (BACTROBAN) 2 %        10/08/21 1637    doxycycline (VIBRAMYCIN) 100 MG capsule  2 times daily        10/08/21 1637              Pauline Aus, PA-C 10/11/21 1209    Cathren Laine, MD 10/11/21 1450

## 2022-01-04 ENCOUNTER — Other Ambulatory Visit: Payer: Self-pay

## 2022-01-04 ENCOUNTER — Encounter (HOSPITAL_COMMUNITY): Payer: Self-pay | Admitting: *Deleted

## 2022-01-04 ENCOUNTER — Emergency Department (HOSPITAL_COMMUNITY)
Admission: EM | Admit: 2022-01-04 | Discharge: 2022-01-04 | Disposition: A | Discharge status: Home / Self Care | Payer: Managed Care, Other (non HMO) | Attending: Emergency Medicine | Admitting: Emergency Medicine

## 2022-01-04 DIAGNOSIS — H9201 Otalgia, right ear: Secondary | ICD-10-CM | POA: Diagnosis present

## 2022-01-04 DIAGNOSIS — Z20822 Contact with and (suspected) exposure to covid-19: Secondary | ICD-10-CM | POA: Diagnosis not present

## 2022-01-04 DIAGNOSIS — I10 Essential (primary) hypertension: Secondary | ICD-10-CM | POA: Diagnosis not present

## 2022-01-04 DIAGNOSIS — R519 Headache, unspecified: Secondary | ICD-10-CM

## 2022-01-04 DIAGNOSIS — R112 Nausea with vomiting, unspecified: Secondary | ICD-10-CM | POA: Insufficient documentation

## 2022-01-04 LAB — RESP PANEL BY RT-PCR (RSV, FLU A&B, COVID)  RVPGX2
Influenza A by PCR: NEGATIVE
Influenza B by PCR: NEGATIVE
Resp Syncytial Virus by PCR: NEGATIVE
SARS Coronavirus 2 by RT PCR: NEGATIVE

## 2022-01-04 MED ORDER — NEOMYCIN-POLYMYXIN-HC 1 % OT SOLN
3.0000 [drp] | Freq: Once | OTIC | Status: AC
  Administered 2022-01-04: 3 [drp] via OTIC
  Filled 2022-01-04: qty 10

## 2022-01-04 MED ORDER — ONDANSETRON 4 MG PO TBDP
4.0000 mg | ORAL_TABLET | Freq: Once | ORAL | Status: AC
  Administered 2022-01-04: 4 mg via ORAL
  Filled 2022-01-04: qty 1

## 2022-01-04 MED ORDER — ACETAMINOPHEN 500 MG PO TABS
1000.0000 mg | ORAL_TABLET | Freq: Once | ORAL | Status: AC
  Administered 2022-01-04: 1000 mg via ORAL
  Filled 2022-01-04: qty 2

## 2022-01-04 MED ORDER — ONDANSETRON 4 MG PO TBDP: 4.0000 mg | ORAL_TABLET | Freq: Three times a day (TID) | ORAL | 0 refills | Status: AC | PRN

## 2022-01-04 NOTE — Discharge Instructions (Addendum)
Please take your medications as prescribed. Please use cortisporin ear drop as directed. Please take tylenol/ibuprofen for pain. I recommend close follow-up with PCP for reevaluation.  Please do not hesitate to return to emergency department if worrisome signs symptoms we discussed become apparent.

## 2022-01-04 NOTE — ED Provider Notes (Signed)
[facility] EMERGENCY DEPARTMENT Provider Note   CSN: [id number] Arrival date & time: 01/04/22  1452     History  Chief Complaint  Patient presents with   Emesis    Nancy Morrison is a 36 y.o. female with a past medical history of hypertension presenting to the emergency department for evaluation of headache, earaches for 4 days.  Patient reports she started to have nausea and vomiting this morning.  She is not sure if she is around someone who is sick.  Denies cough, nasal congestion, fever, chest pain, shortness of breath, bowel changes, urinary symptoms, rash.   Emesis     Past Medical History:  Diagnosis Date   Hypertension    Past Surgical History:  Procedure Laterality Date   LEG SURGERY     left leg     Home Medications Prior to Admission medications   Medication Sig Start Date End Date Taking? Authorizing Provider  Homeopathic Products [facility] KIDS EAR RELIEF) SOLN Place 5 drops in ear(s) daily as needed (earache).   Yes [provider]  ondansetron (ZOFRAN-ODT) 4 MG disintegrating tablet Take 1 tablet (4 mg total) by mouth every 8 (eight) hours as needed for nausea or vomiting. 01/04/22  Yes Jeanelle Malling, PA  Pseudoephedrine-APAP-DM (DAYQUIL PO) Take 2 capsules by mouth 2 (two) times daily as needed (sinus congestion/headache).   Yes [provider]      Allergies    Lactose intolerance (gi) and Penicillins    Review of Systems   Review of Systems  Gastrointestinal:  Positive for vomiting.    Physical Exam Updated Vital Signs BP (!) 162/94 (BP Location: Right Arm)   Pulse (!) 58   Temp 98.3 F (36.8 C) (Oral)   Resp 17   Ht 5\' 9"  (1.753 m)   Wt 119.3 kg   LMP 01/01/2022   SpO2 100%   BMI 38.84 kg/m  Physical Exam Vitals and nursing note reviewed.  Constitutional:      Appearance: Normal appearance.  HENT:     Head: Normocephalic and atraumatic.     Ears:     Comments: Earwax in R ear. TM is non visible.    Mouth/Throat:      Mouth: Mucous membranes are moist.  Eyes:     General: No scleral icterus. Cardiovascular:     Rate and Rhythm: Normal rate and regular rhythm.     Pulses: Normal pulses.     Heart sounds: Normal heart sounds.  Pulmonary:     Effort: Pulmonary effort is normal.     Breath sounds: Normal breath sounds.  Abdominal:     General: Abdomen is flat.     Palpations: Abdomen is soft.     Tenderness: There is no abdominal tenderness.  Musculoskeletal:        General: No deformity.  Skin:    General: Skin is warm.     Findings: No rash.  Neurological:     General: No focal deficit present.     Mental Status: She is alert.  Psychiatric:        Mood and Affect: Mood normal.     ED Results / Procedures / Treatments   Labs (all labs ordered are listed, but only abnormal results are displayed) Labs Reviewed  RESP PANEL BY RT-PCR (RSV, FLU A&B, COVID)  RVPGX2    EKG None  Radiology No results found.  Procedures Procedures    Medications Ordered in ED Medications  ondansetron (ZOFRAN-ODT) disintegrating tablet 4 mg (  4 mg Oral Given 01/04/22 1651)  acetaminophen (TYLENOL) tablet 1,000 mg (1,000 mg Oral Given 01/04/22 1651)  NEOMYCIN-POLYMYXIN-HYDROCORTISONE (CORTISPORIN) OTIC (EAR) solution 3 drop (3 drops Right EAR Given 01/04/22 1657)    ED Course/ Medical Decision Making/ A&P                           Medical Decision Making Risk OTC drugs. Prescription drug management.   This patient presents to the ED for headache, earaches, nausea, vomiting, this involves an extensive number of treatment options, and is a complaint that carries with a high risk of complications and morbidity.  The differential diagnosis includes otitis media, tension type headache, flu, COVID, RSV, infectious etiology.  This is not an exhaustive list.  Comorbidities that complicate the patient evaluation See HPI  Social determinants of health NA  Additional history obtained: External records  from outside source obtained and reviewed including: Chart review including previous notes, labs, imaging.  Cardiac monitoring/EKG: The patient was maintained on a cardiac monitor.  I personally reviewed and interpreted the cardiac monitor which showed an underlying rhythm of: Sinus rhythm.  Lab tests: I ordered and personally interpreted labs.  The pertinent results include: Respiratory panel negative.  Imaging studies: I ordered imaging studies including:   Problem list/ ED course/ Critical interventions/ Medical management: HPI: See above Vital signs within normal range and stable throughout visit. Laboratory/imaging studies significant for: See above. On physical examination, patient is afebrile and appears in no acute distress.  Patient reports symptoms of headache, ear aches in the last 4 days.  There was earwax that obstruct the view of the TM when looking in her right ear.  I ordered Cortisporin ordered drop, Zofran, Tylenol.  Based on patient's clinical presentations and laboratory/imaging studies I suspect otitis externa. Reevaluation of the patient after these medications showed that the patient improved. Advised patient to continue using the eardrop, take Tylenol/ibuprofen for pain.  Take Zofran for nausea.  Follow-up with primary care physician for further evaluation and management.  Return to the ER if new or worsening symptoms.. I have reviewed the patient home medicines and have made adjustments as needed.  Consultations obtained:  Disposition Continued outpatient therapy. Follow-up with PCP recommended for reevaluation of symptoms. Treatment plan discussed with patient.  Pt acknowledged understanding was agreeable to the plan. Worrisome signs and symptoms were discussed with patient, and patient acknowledged understanding to return to the ED if they noticed these signs and symptoms. Patient was stable upon discharge.   This chart was dictated using voice recognition software.   Despite best efforts to proofread,  errors can occur which can change the documentation meaning.          Final Clinical Impression(s) / ED Diagnoses Final diagnoses:  Right ear pain  Nonintractable headache, unspecified chronicity pattern, unspecified headache type  Nausea and vomiting, unspecified vomiting type    Rx / DC Orders ED Discharge Orders          Ordered    ondansetron (ZOFRAN-ODT) 4 MG disintegrating tablet  Every 8 hours PRN        01/04/22 1829              Jeanelle Malling, PA 01/04/22 2115    Terrilee Files, MD 01/05/22 1030

## 2022-01-04 NOTE — ED Triage Notes (Signed)
Pt with c/o emesis, right ear pain and HA.   HA started 3 days ago.  Ear ache since last night.  Emesis started this morning.  Unknown of fevers.

## 2022-03-27 ENCOUNTER — Encounter: Payer: Self-pay | Admitting: Radiology

## 2022-03-28 ENCOUNTER — Emergency Department (HOSPITAL_COMMUNITY)
Admission: EM | Admit: 2022-03-28 | Discharge: 2022-03-29 | Disposition: A | Discharge status: Home / Self Care | Payer: 59 | Attending: Emergency Medicine | Admitting: Emergency Medicine

## 2022-03-28 ENCOUNTER — Other Ambulatory Visit: Payer: Self-pay

## 2022-03-28 ENCOUNTER — Emergency Department (HOSPITAL_COMMUNITY): Payer: 59

## 2022-03-28 DIAGNOSIS — R600 Localized edema: Secondary | ICD-10-CM | POA: Insufficient documentation

## 2022-03-28 DIAGNOSIS — M7989 Other specified soft tissue disorders: Secondary | ICD-10-CM | POA: Diagnosis present

## 2022-03-28 LAB — COMPREHENSIVE METABOLIC PANEL
ALT: 21 U/L (ref 0–44)
AST: 20 U/L (ref 15–41)
Albumin: 3.6 g/dL (ref 3.5–5.0)
Alkaline Phosphatase: 59 U/L (ref 38–126)
Anion gap: 7 (ref 5–15)
BUN: 10 mg/dL (ref 6–20)
CO2: 25 mmol/L (ref 22–32)
Calcium: 8.5 mg/dL — ABNORMAL LOW (ref 8.9–10.3)
Chloride: 103 mmol/L (ref 98–111)
Creatinine, Ser: 0.7 mg/dL (ref 0.44–1.00)
GFR, Estimated: >60 mL/min (ref >60)
Glucose, Bld: 97 mg/dL (ref 70–99)
Potassium: 3.5 mmol/L (ref 3.5–5.1)
Sodium: 135 mmol/L (ref 135–145)
Total Bilirubin: 0.5 mg/dL (ref 0.3–1.2)
Total Protein: 7.2 g/dL (ref 6.5–8.1)

## 2022-03-28 MED ORDER — FUROSEMIDE 10 MG/ML IJ SOLN
20.0000 mg | Freq: Once | INTRAMUSCULAR | Status: AC
  Administered 2022-03-28: 20 mg via INTRAVENOUS
  Filled 2022-03-28: qty 2

## 2022-03-28 NOTE — ED Triage Notes (Signed)
Pt arrived via POV from home c/o bilateral lower extremity swelling X 1 week. Pt reports swelling is worse when she wakes up. Pt reports she has tried OTC "water pills" w/o relief. Pt ambulatory to Triage. Pt presents in NAD.

## 2022-03-28 NOTE — ED Provider Notes (Signed)
[facility] Provider Note   CSN: [id number] Arrival date & time: 03/28/22  2249     History {Add pertinent medical, surgical, social history, OB history to HPI:1} Chief Complaint  Patient presents with   Leg Swelling    Nancy Morrison is a 37 y.o. female.  Patient presents to the emergency department for evaluation of swelling of her legs.  Patient reports symptoms have been ongoing for several days.  Fluid is worse after she has not been on her feet for a while at work.  She is experiencing pain because of the swelling.  No chest pain or shortness of breath.  She tried over-the-counter diuretics without improvement.       Home Medications Prior to Admission medications   Medication Sig Start Date End Date Taking? Authorizing Provider  Homeopathic Products [facility] KIDS EAR RELIEF) SOLN Place 5 drops in ear(s) daily as needed (earache).    [provider]  ondansetron (ZOFRAN-ODT) 4 MG disintegrating tablet Take 1 tablet (4 mg total) by mouth every 8 (eight) hours as needed for nausea or vomiting. 01/04/22   Rex Kras, PA  Pseudoephedrine-APAP-DM (DAYQUIL PO) Take 2 capsules by mouth 2 (two) times daily as needed (sinus congestion/headache).    [provider]      Allergies    Lactose intolerance (gi) and Penicillins    Review of Systems   Review of Systems  Physical Exam Updated Vital Signs Pulse 67   Temp 98.5 F (36.9 C) (Oral)   Resp 18   Ht '5\' 9"'$  (1.753 m)   Wt 122.5 kg   SpO2 100%   BMI 39.87 kg/m  Physical Exam Vitals and nursing note reviewed.  Constitutional:      General: She is not in acute distress.    Appearance: She is well-developed.  HENT:     Head: Normocephalic and atraumatic.     Mouth/Throat:     Mouth: Mucous membranes are moist.  Eyes:     General: Vision grossly intact. Gaze aligned appropriately.     Extraocular Movements: Extraocular movements intact.      Conjunctiva/sclera: Conjunctivae normal.  Cardiovascular:     Rate and Rhythm: Normal rate and regular rhythm.     Pulses: Normal pulses.     Heart sounds: Normal heart sounds, S1 normal and S2 normal. No murmur heard.    No friction rub. No gallop.  Pulmonary:     Effort: Pulmonary effort is normal. No respiratory distress.     Breath sounds: Normal breath sounds.  Abdominal:     General: Bowel sounds are normal.     Palpations: Abdomen is soft.     Tenderness: There is no abdominal tenderness. There is no guarding or rebound.     Hernia: No hernia is present.  Musculoskeletal:        General: No swelling.     Cervical back: Full passive range of motion without pain, normal range of motion and neck supple. No spinous process tenderness or muscular tenderness. Normal range of motion.     Right lower leg: Edema present.     Left lower leg: Edema present.  Skin:    General: Skin is warm and dry.     Capillary Refill: Capillary refill takes less than 2 seconds.     Findings: No ecchymosis, erythema, rash or wound.  Neurological:     General: No focal deficit present.     Mental Status: She is alert  and oriented to person, place, and time.     GCS: GCS eye subscore is 4. GCS verbal subscore is 5. GCS motor subscore is 6.     Cranial Nerves: Cranial nerves 2-12 are intact.     Sensory: Sensation is intact.     Motor: Motor function is intact.     Coordination: Coordination is intact.  Psychiatric:        Attention and Perception: Attention normal.        Mood and Affect: Mood normal.        Speech: Speech normal.        Behavior: Behavior normal.     ED Results / Procedures / Treatments   Labs (all labs ordered are listed, but only abnormal results are displayed) Labs Reviewed  CBC  COMPREHENSIVE METABOLIC PANEL  BRAIN NATRIURETIC PEPTIDE  URINALYSIS, ROUTINE W REFLEX MICROSCOPIC    EKG None  Radiology No results found.  Procedures Procedures  {Document cardiac  monitor, telemetry assessment procedure when appropriate:1}  Medications Ordered in ED Medications - No data to display  ED Course/ Medical Decision Making/ A&P   {   Click here for ABCD2, HEART and other calculatorsREFRESH Note before signing :1}                          Medical Decision Making Amount and/or Complexity of Data Reviewed Labs: ordered. Decision-making details documented in ED Course. Radiology: ordered and independent interpretation performed. Decision-making details documented in ED Course.  Risk Prescription drug management.  Differential Diagnosis considered includes, but not limited to: CHF; venous stasis; DVT; renal failure; nephrotic syndrome; anasarca     {Document critical care time when appropriate:1} {Document review of labs and clinical decision tools ie heart score, Chads2Vasc2 etc:1}  {Document your independent review of radiology images, and any outside records:1} {Document your discussion with family members, caretakers, and with consultants:1} {Document social determinants of health affecting pt's care:1} {Document your decision making why or why not admission, treatments were needed:1} Final Clinical Impression(s) / ED Diagnoses Final diagnoses:  None    Rx / DC Orders ED Discharge Orders     None

## 2022-03-29 LAB — URINALYSIS, ROUTINE W REFLEX MICROSCOPIC
Bilirubin Urine: NEGATIVE
Glucose, UA: NEGATIVE mg/dL
Ketones, ur: NEGATIVE mg/dL
Nitrite: NEGATIVE
Protein, ur: NEGATIVE mg/dL
RBC / HPF: >50 RBC/hpf (ref 0–5)
Specific Gravity, Urine: 1.012 (ref 1.005–1.030)
pH: 7 (ref 5.0–8.0)

## 2022-03-29 LAB — CBC
HCT: 35.8 % — ABNORMAL LOW (ref 36.0–46.0)
Hemoglobin: 11.4 g/dL — ABNORMAL LOW (ref 12.0–15.0)
MCH: 28.5 pg (ref 26.0–34.0)
MCHC: 31.8 g/dL (ref 30.0–36.0)
MCV: 89.5 fL (ref 80.0–100.0)
Platelets: 264 10*3/uL (ref 150–400)
RBC: 4 MIL/uL (ref 3.87–5.11)
RDW: 14.6 % (ref 11.5–15.5)
WBC: 8.6 10*3/uL (ref 4.0–10.5)
nRBC: 0 % (ref 0.0–0.2)

## 2022-03-29 LAB — BRAIN NATRIURETIC PEPTIDE: B Natriuretic Peptide: 21 pg/mL (ref 0.0–100.0)

## 2022-03-29 MED ORDER — FUROSEMIDE 20 MG PO TABS: 20.0000 mg | ORAL_TABLET | Freq: Every day | ORAL | 0 refills | Status: AC

## 2022-03-29 NOTE — Discharge Instructions (Addendum)
Purchase compression/support stockings at the pharmacy.  Wear these when you will be on your feet for extended period of times as they will help prevent the swelling.  Try to elevate your legs frequently.  You need to find a primary care doctor to follow-up with.

## 2022-04-26 ENCOUNTER — Other Ambulatory Visit: Payer: Self-pay

## 2022-04-26 ENCOUNTER — Emergency Department (HOSPITAL_COMMUNITY): Payer: 59

## 2022-04-26 ENCOUNTER — Encounter (HOSPITAL_COMMUNITY): Payer: Self-pay

## 2022-04-26 ENCOUNTER — Emergency Department (HOSPITAL_COMMUNITY)
Admission: EM | Admit: 2022-04-26 | Discharge: 2022-04-27 | Disposition: A | Discharge status: Home / Self Care | Payer: 59 | Attending: Emergency Medicine | Admitting: Emergency Medicine

## 2022-04-26 DIAGNOSIS — R600 Localized edema: Secondary | ICD-10-CM | POA: Insufficient documentation

## 2022-04-26 LAB — BASIC METABOLIC PANEL
Anion gap: 5 (ref 5–15)
BUN: 10 mg/dL (ref 6–20)
CO2: 27 mmol/L (ref 22–32)
Calcium: 8.2 mg/dL — ABNORMAL LOW (ref 8.9–10.3)
Chloride: 104 mmol/L (ref 98–111)
Creatinine, Ser: 0.72 mg/dL (ref 0.44–1.00)
GFR, Estimated: >60 mL/min (ref >60)
Glucose, Bld: 109 mg/dL — ABNORMAL HIGH (ref 70–99)
Potassium: 3.6 mmol/L (ref 3.5–5.1)
Sodium: 136 mmol/L (ref 135–145)

## 2022-04-26 LAB — CBC WITH DIFFERENTIAL/PLATELET
Abs Immature Granulocytes: 0.08 10*3/uL — ABNORMAL HIGH (ref 0.00–0.07)
Basophils Absolute: 0.1 10*3/uL (ref 0.0–0.1)
Basophils Relative: 1 %
Eosinophils Absolute: 0.2 10*3/uL (ref 0.0–0.5)
Eosinophils Relative: 2 %
HCT: 35.2 % — ABNORMAL LOW (ref 36.0–46.0)
Hemoglobin: 11.2 g/dL — ABNORMAL LOW (ref 12.0–15.0)
Immature Granulocytes: 1 %
Lymphocytes Relative: 21 %
Lymphs Abs: 2 10*3/uL (ref 0.7–4.0)
MCH: 28.4 pg (ref 26.0–34.0)
MCHC: 31.8 g/dL (ref 30.0–36.0)
MCV: 89.1 fL (ref 80.0–100.0)
Monocytes Absolute: 1 10*3/uL (ref 0.1–1.0)
Monocytes Relative: 10 %
Neutro Abs: 6.3 10*3/uL (ref 1.7–7.7)
Neutrophils Relative %: 65 %
Platelets: 273 10*3/uL (ref 150–400)
RBC: 3.95 MIL/uL (ref 3.87–5.11)
RDW: 14.6 % (ref 11.5–15.5)
WBC: 9.7 10*3/uL (ref 4.0–10.5)
nRBC: 0 % (ref 0.0–0.2)

## 2022-04-26 LAB — URINALYSIS, ROUTINE W REFLEX MICROSCOPIC
Bilirubin Urine: NEGATIVE
Glucose, UA: NEGATIVE mg/dL
Hgb urine dipstick: NEGATIVE
Ketones, ur: NEGATIVE mg/dL
Leukocytes,Ua: NEGATIVE
Nitrite: NEGATIVE
Protein, ur: NEGATIVE mg/dL
Specific Gravity, Urine: 1.02 (ref 1.005–1.030)
pH: 6 (ref 5.0–8.0)

## 2022-04-26 LAB — BRAIN NATRIURETIC PEPTIDE: B Natriuretic Peptide: 22 pg/mL (ref 0.0–100.0)

## 2022-04-26 LAB — PREGNANCY, URINE: Preg Test, Ur: NEGATIVE

## 2022-04-26 NOTE — ED Triage Notes (Signed)
Patient reports swelling in her feet bilaterally with slight pain. Patient reports having urinary frequency aswell. Patient was taking lasix but no longer has an RX for it.

## 2022-04-27 NOTE — ED Provider Notes (Signed)
[facility] Provider Note   CSN: [id number] Arrival date & time: 04/26/22  2100     History  Chief Complaint  Patient presents with   Foot Pain    Nancy Morrison is a 37 y.o. female.  37 yo F here with persistent edema. Seen here previously for same. Tried lasix. Worked ok. Today was up on her feet all day at an easter egg hunt and they got really swollen and painful again. Tried sleeping with her feet up. Didn't get better. Came here for eval. No sob, cp, fever or other associated symptoms.    Foot Pain       Home Medications Prior to Admission medications   Medication Sig Start Date End Date Taking? Authorizing Provider  furosemide (LASIX) 20 MG tablet Take 1 tablet (20 mg total) by mouth daily. 03/29/22   Orpah Greek, MD  Homeopathic Products [facility] KIDS EAR RELIEF) SOLN Place 5 drops in ear(s) daily as needed (earache).    [provider]  ondansetron (ZOFRAN-ODT) 4 MG disintegrating tablet Take 1 tablet (4 mg total) by mouth every 8 (eight) hours as needed for nausea or vomiting. 01/04/22   Rex Kras, PA  Pseudoephedrine-APAP-DM (DAYQUIL PO) Take 2 capsules by mouth 2 (two) times daily as needed (sinus congestion/headache).    [provider]      Allergies    Lactose intolerance (gi) and Penicillins    Review of Systems   Review of Systems  Physical Exam Updated Vital Signs BP (!) 155/82   Pulse 99   Temp 98.8 F (37.1 C) (Oral)   Resp 17   Ht 5\' 9"  (1.753 m)   Wt 126.6 kg   LMP 02/25/2022   SpO2 99%   BMI 41.22 kg/m  Physical Exam Vitals and nursing note reviewed.  Constitutional:      Appearance: She is well-developed.  HENT:     Head: Normocephalic and atraumatic.  Eyes:     Pupils: Pupils are equal, round, and reactive to light.  Cardiovascular:     Rate and Rhythm: Normal rate and regular rhythm.  Pulmonary:     Effort: No respiratory distress.     Breath sounds: No  stridor.  Abdominal:     General: There is no distension.  Musculoskeletal:        General: Swelling (trace nonpitting LE edema bilaterally to just above ankles) present.     Cervical back: Normal range of motion.  Skin:    General: Skin is warm and dry.  Neurological:     General: No focal deficit present.     Mental Status: She is alert.     ED Results / Procedures / Treatments   Labs (all labs ordered are listed, but only abnormal results are displayed) Labs Reviewed  CBC WITH DIFFERENTIAL/PLATELET - Abnormal; Notable for the following components:      Result Value   Hemoglobin 11.2 (*)    HCT 35.2 (*)    Abs Immature Granulocytes 0.08 (*)    All other components within normal limits  BASIC METABOLIC PANEL - Abnormal; Notable for the following components:   Glucose, Bld 109 (*)    Calcium 8.2 (*)    All other components within normal limits  URINALYSIS, ROUTINE W REFLEX MICROSCOPIC  PREGNANCY, URINE  BRAIN NATRIURETIC PEPTIDE    EKG None  Radiology DG Chest Portable 1 View  Result Date: 04/26/2022 CLINICAL DATA:  Leg swelling EXAM: PORTABLE CHEST  1 VIEW COMPARISON:  None Available. FINDINGS: The heart size and mediastinal contours are within normal limits. Both lungs are clear. The visualized skeletal structures are unremarkable. IMPRESSION: No active disease. Electronically Signed   By: Fidela Salisbury M.D.   On: 04/26/2022 22:51    Procedures Procedures    Medications Ordered in ED Medications - No data to display  ED Course/ Medical Decision Making/ A&P                             Medical Decision Making Amount and/or Complexity of Data Reviewed Labs: ordered.   No e/o HF/AKI. Becoming more chronic at this piont with negative workups. Suggested elevation, compression stockings and PCP follow up if not improving.   Final Clinical Impression(s) / ED Diagnoses Final diagnoses:  Peripheral edema    Rx / DC Orders ED Discharge Orders     None          Omer Monter, Corene Cornea, MD 04/27/22 [phone]

## 2022-06-23 ENCOUNTER — Encounter (HOSPITAL_COMMUNITY): Payer: Self-pay | Admitting: Emergency Medicine

## 2022-06-23 ENCOUNTER — Other Ambulatory Visit: Payer: Self-pay

## 2022-06-23 ENCOUNTER — Emergency Department (HOSPITAL_COMMUNITY)
Admission: EM | Admit: 2022-06-23 | Discharge: 2022-06-23 | Disposition: A | Discharge status: Home / Self Care | Payer: 59 | Attending: Emergency Medicine | Admitting: Emergency Medicine

## 2022-06-23 DIAGNOSIS — K649 Unspecified hemorrhoids: Secondary | ICD-10-CM

## 2022-06-23 DIAGNOSIS — K625 Hemorrhage of anus and rectum: Secondary | ICD-10-CM | POA: Diagnosis present

## 2022-06-23 LAB — COMPREHENSIVE METABOLIC PANEL
ALT: 20 U/L (ref 0–44)
AST: 29 U/L (ref 15–41)
Albumin: 3.9 g/dL (ref 3.5–5.0)
Alkaline Phosphatase: 71 U/L (ref 38–126)
Anion gap: 15 (ref 5–15)
BUN: 16 mg/dL (ref 6–20)
CO2: 19 mmol/L — ABNORMAL LOW (ref 22–32)
Calcium: 9.4 mg/dL (ref 8.9–10.3)
Chloride: 104 mmol/L (ref 98–111)
Creatinine, Ser: 0.95 mg/dL (ref 0.44–1.00)
GFR, Estimated: >60 mL/min (ref >60)
Glucose, Bld: 120 mg/dL — ABNORMAL HIGH (ref 70–99)
Potassium: 3.9 mmol/L (ref 3.5–5.1)
Sodium: 138 mmol/L (ref 135–145)
Total Bilirubin: 0.6 mg/dL (ref 0.3–1.2)
Total Protein: 7.6 g/dL (ref 6.5–8.1)

## 2022-06-23 LAB — CBC
HCT: 40.7 % (ref 36.0–46.0)
Hemoglobin: 13.1 g/dL (ref 12.0–15.0)
MCH: 28.4 pg (ref 26.0–34.0)
MCHC: 32.2 g/dL (ref 30.0–36.0)
MCV: 88.1 fL (ref 80.0–100.0)
Platelets: 344 10*3/uL (ref 150–400)
RBC: 4.62 MIL/uL (ref 3.87–5.11)
RDW: 14.6 % (ref 11.5–15.5)
WBC: 9.8 10*3/uL (ref 4.0–10.5)
nRBC: 0 % (ref 0.0–0.2)

## 2022-06-23 LAB — POC URINE PREG, ED: Preg Test, Ur: NEGATIVE

## 2022-06-23 NOTE — ED Provider Notes (Signed)
   [place] EMERGENCY DEPARTMENT AT [facility]  Provider Note  CSN: [id number] Arrival date & time: 06/23/22 2117  History Chief Complaint  Patient presents with   Rectal Bleeding    Nancy Morrison is a 37 y.o. female reports 2 days of rectal bleeding. Initially she thought it was her menstrual period but then she realized it was coming from her rectum. She sees blood with BM but also with wiping after urinating. No pain, no constipation. No prior history of similar.    Home Medications Prior to Admission medications   Medication Sig Start Date End Date Taking? Authorizing Provider  furosemide (LASIX) 20 MG tablet Take 1 tablet (20 mg total) by mouth daily. 03/29/22   Gilda Crease, MD  [employer] [facility] KIDS EAR RELIEF) SOLN Place 5 drops in ear(s) daily as needed (earache).    [provider]  ondansetron (ZOFRAN-ODT) 4 MG disintegrating tablet Take 1 tablet (4 mg total) by mouth every 8 (eight) hours as needed for nausea or vomiting. 01/04/22   Jeanelle Malling, PA  Pseudoephedrine-APAP-DM (DAYQUIL PO) Take 2 capsules by mouth 2 (two) times daily as needed (sinus congestion/headache).    [provider]     Allergies    Lactose intolerance (gi) and Penicillins   Review of Systems   Review of Systems Please see HPI for pertinent positives and negatives  Physical Exam BP 129/86 (BP Location: Right Arm)   Pulse (!) 122   Temp 99.5 F (37.5 C) (Oral)   Resp 15   Ht 5' 9.5" (1.765 m)   Wt 122.7 kg   LMP 06/10/2022 (Exact Date)   SpO2 96%   BMI 39.37 kg/m   Physical Exam Vitals and nursing note reviewed. Exam conducted with a chaperone present.  HENT:     Head: Normocephalic.     Nose: Nose normal.  Eyes:     Extraocular Movements: Extraocular movements intact.  Pulmonary:     Effort: Pulmonary effort is normal.  Genitourinary:    Comments: Small hemorrhoid at 12 o'clock which shows signs of recent  bleeding. Musculoskeletal:        General: Normal range of motion.     Cervical back: Neck supple.  Skin:    Findings: No rash (on exposed skin).  Neurological:     Mental Status: She is alert and oriented to person, place, and time.  Psychiatric:        Mood and Affect: Mood normal.     ED Results / Procedures / Treatments   EKG None  Procedures Procedures  Medications Ordered in the ED Medications - No data to display  Initial Impression and Plan  Patient here with small bleeding hemorrhoid. Otherwise exam is reassuring. Labs done in triage show normal CBC and CMP. Patient advised to use OTC hemorrhoid remedies. Suspect this will resolve in a few days, follow up with PCP for further management. RTED for any other concerns.   ED Course       MDM Rules/Calculators/A&P Medical Decision Making Problems Addressed: Bleeding hemorrhoid: acute illness or injury  Amount and/or Complexity of Data Reviewed Labs: ordered. Decision-making details documented in ED Course.  Risk OTC drugs.     Final Clinical Impression(s) / ED Diagnoses Final diagnoses:  Bleeding hemorrhoid    Rx / DC Orders ED Discharge Orders     None        Pollyann Savoy, MD 06/23/22 2305

## 2022-06-23 NOTE — ED Triage Notes (Signed)
Pt reports rectal bleeding since Saturday with small clots reported today. Pt denies pain.

## 2022-06-24 LAB — TYPE AND SCREEN
ABO/RH(D): O POS
Antibody Screen: NEGATIVE

## 2022-09-06 ENCOUNTER — Other Ambulatory Visit: Payer: Self-pay

## 2022-09-06 ENCOUNTER — Emergency Department (HOSPITAL_COMMUNITY)
Admission: EM | Admit: 2022-09-06 | Discharge: 2022-09-07 | Disposition: A | Discharge status: Home / Self Care | Payer: 59 | Attending: Emergency Medicine | Admitting: Emergency Medicine

## 2022-09-06 DIAGNOSIS — R059 Cough, unspecified: Secondary | ICD-10-CM | POA: Diagnosis present

## 2022-09-06 DIAGNOSIS — I10 Essential (primary) hypertension: Secondary | ICD-10-CM | POA: Insufficient documentation

## 2022-09-06 DIAGNOSIS — U071 COVID-19: Secondary | ICD-10-CM | POA: Diagnosis not present

## 2022-09-06 LAB — SARS CORONAVIRUS 2 BY RT PCR: SARS Coronavirus 2 by RT PCR: POSITIVE — AB

## 2022-09-06 NOTE — ED Triage Notes (Addendum)
Productive cough started today. Pt states 4 people from work have COVID. Body aches and pain with coughing. Tylenol taken prior to arrival

## 2022-09-07 NOTE — ED Notes (Signed)
Pt stated "yesterday I had a headache and cough." Pt stated they felt hot earlier today

## 2022-09-07 NOTE — ED Provider Notes (Signed)
   EMERGENCY DEPARTMENT AT [facility] Provider Note   CSN: [id number] Arrival date & time: 09/06/22  2217     History  Chief Complaint  Patient presents with   Cough    Nancy Morrison is a 37 y.o. female.  The history is provided by the patient.  Patient w/history of hypertension presents with COVID symptoms.  Patient reports recent sick contacts.  She reports over the past day she has had headache, cough and bodyaches.  No shortness of breath.  No active chest pain.  No hemoptysis is reported.  She is a non-smoker.  She is not vaccinated, but has had COVID twice before    Past Medical History:  Diagnosis Date   Hypertension     Home Medications Prior to Admission medications   Medication Sig Start Date End Date Taking? Authorizing Provider  furosemide (LASIX) 20 MG tablet Take 1 tablet (20 mg total) by mouth daily. 03/29/22   Gilda Crease, MD  [employer] [facility] KIDS EAR RELIEF) SOLN Place 5 drops in ear(s) daily as needed (earache).    [provider]  ondansetron (ZOFRAN-ODT) 4 MG disintegrating tablet Take 1 tablet (4 mg total) by mouth every 8 (eight) hours as needed for nausea or vomiting. 01/04/22   Jeanelle Malling, PA  Pseudoephedrine-APAP-DM (DAYQUIL PO) Take 2 capsules by mouth 2 (two) times daily as needed (sinus congestion/headache).    [provider]      Allergies    Lactose intolerance (gi) and Penicillins    Review of Systems   Review of Systems  Physical Exam Updated Vital Signs BP (!) 148/94   Pulse (!) 105   Temp (!) 100.9 F (38.3 C) (Oral)   Resp 18   Ht 1.753 m (5\' 9" )   Wt 122 kg   SpO2 100%   BMI 39.72 kg/m  Physical Exam CONSTITUTIONAL: Well developed/well nourished HEAD: Normocephalic/atraumatic CV: S1/S2 noted, no murmurs/rubs/gallops noted LUNGS: Lungs are clear to auscultation bilaterally, no apparent distress NEURO: Pt is awake/alert/appropriate, moves all extremitiesx4.  No  facial droop.   SKIN: warm, color normal PSYCH: no abnormalities of mood noted, alert and oriented to situation  ED Results / Procedures / Treatments   Labs (all labs ordered are listed, but only abnormal results are displayed) Labs Reviewed  SARS CORONAVIRUS 2 BY RT PCR - Abnormal; Notable for the following components:      Result Value   SARS Coronavirus 2 by RT PCR POSITIVE (*)    All other components within normal limits    EKG None  Radiology No results found.  Procedures Procedures    Medications Ordered in ED Medications - No data to display  ED Course/ Medical Decision Making/ A&P                                 Medical Decision Making  Found to have COVID.  No hypoxia, no added lung sounds.  She is in no distress. Patient is safe for outpatient management.  No indication for antivirals.         Final Clinical Impression(s) / ED Diagnoses Final diagnoses:  COVID-19    Rx / DC Orders ED Discharge Orders     None         Zadie Rhine, MD 09/07/22 0140

## 2022-10-22 ENCOUNTER — Encounter (HOSPITAL_BASED_OUTPATIENT_CLINIC_OR_DEPARTMENT_OTHER): Payer: Self-pay | Admitting: Emergency Medicine

## 2022-10-22 ENCOUNTER — Emergency Department (HOSPITAL_BASED_OUTPATIENT_CLINIC_OR_DEPARTMENT_OTHER)
Admission: EM | Admit: 2022-10-22 | Discharge: 2022-10-22 | Disposition: A | Discharge status: Home / Self Care | Payer: 59 | Attending: Emergency Medicine | Admitting: Emergency Medicine

## 2022-10-22 ENCOUNTER — Other Ambulatory Visit: Payer: Self-pay

## 2022-10-22 ENCOUNTER — Emergency Department (HOSPITAL_BASED_OUTPATIENT_CLINIC_OR_DEPARTMENT_OTHER): Payer: 59 | Admitting: Radiology

## 2022-10-22 DIAGNOSIS — S60212A Contusion of left wrist, initial encounter: Secondary | ICD-10-CM | POA: Diagnosis not present

## 2022-10-22 DIAGNOSIS — M25562 Pain in left knee: Secondary | ICD-10-CM | POA: Diagnosis not present

## 2022-10-22 DIAGNOSIS — Y92410 Unspecified street and highway as the place of occurrence of the external cause: Secondary | ICD-10-CM | POA: Diagnosis not present

## 2022-10-22 DIAGNOSIS — M25561 Pain in right knee: Secondary | ICD-10-CM | POA: Insufficient documentation

## 2022-10-22 DIAGNOSIS — M25532 Pain in left wrist: Secondary | ICD-10-CM | POA: Diagnosis present

## 2022-10-22 DIAGNOSIS — S60222A Contusion of left hand, initial encounter: Secondary | ICD-10-CM | POA: Insufficient documentation

## 2022-10-22 MED ORDER — KETOROLAC TROMETHAMINE 60 MG/2ML IM SOLN
30.0000 mg | Freq: Once | INTRAMUSCULAR | Status: AC
  Administered 2022-10-22: 30 mg via INTRAMUSCULAR
  Filled 2022-10-22: qty 2
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
## 2022-10-22 NOTE — Discharge Instructions (Addendum)
Your x-ray imaging was negative for fracture or dislocation, recommend RICE: Rest, ice, compression as needed with an Ace wrap for comfort, elevation of the extremity and NSAIDs for pain control.
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
## 2023-01-21 ENCOUNTER — Other Ambulatory Visit: Payer: Self-pay

## 2023-01-21 ENCOUNTER — Encounter (HOSPITAL_COMMUNITY): Payer: Self-pay

## 2023-01-21 ENCOUNTER — Emergency Department (HOSPITAL_COMMUNITY)
Admission: EM | Admit: 2023-01-21 | Discharge: 2023-01-21 | Disposition: A | Discharge status: Home / Self Care | Payer: 59 | Attending: Emergency Medicine | Admitting: Emergency Medicine

## 2023-01-21 ENCOUNTER — Emergency Department (HOSPITAL_COMMUNITY): Payer: 59

## 2023-01-21 DIAGNOSIS — W260XXA Contact with knife, initial encounter: Secondary | ICD-10-CM | POA: Diagnosis not present

## 2023-01-21 DIAGNOSIS — S61217A Laceration without foreign body of left little finger without damage to nail, initial encounter: Secondary | ICD-10-CM | POA: Insufficient documentation

## 2023-01-21 MED ORDER — LIDOCAINE HCL (PF) 1 % IJ SOLN
30.0000 mL | Freq: Once | INTRAMUSCULAR | Status: AC
  Administered 2023-01-21: 30 mL
  Filled 2023-01-21: qty 30

## 2023-01-21 MED ORDER — DOUBLE ANTIBIOTIC 500-10000 UNIT/GM EX OINT
TOPICAL_OINTMENT | Freq: Once | CUTANEOUS | Status: AC
  Filled 2023-01-21: qty 1

## 2023-01-21 NOTE — Discharge Instructions (Addendum)
The pleasure taking care of you today.  You were seen in the emergency department for a laceration to your left little finger.  You had x-rays that did not show any foreign body or damage to the bone.  There was no sign on exam of injury to a nerve or tendon.  We repaired the wound with sutures.  Keep this clean and dry, keep covered.  Follow-up in about a week for suture removal.  If you develop redness, drainage, swelling or fevers come back, as these are signs of infection.

## 2023-01-21 NOTE — ED Triage Notes (Signed)
Pt c/o laceration to posterior L pinky finger.  Pt reports cutting it w/ a knife.  Laceration is still bleeding when pressure is not applied.    Denies blood thinners.  Unknown last tetanus.

## 2023-01-21 NOTE — ED Provider Notes (Signed)
[place] EMERGENCY DEPARTMENT AT [facility] Provider Note   CSN: [id number] Arrival date & time: 01/21/23  6295     History  Chief Complaint  Patient presents with   Laceration    Nancy Morrison is a 37 y.o. female.She presents c/o laceration to left little finger. Sustained with a knife, trying to cut a zip tie off of a toy for  her child. No numbness, tingling or loss of ROM.  Patient states bleeding will not stop at home so she decided to come to the ER for evaluation.  Last tetanus was within the past several years, while pregnant with her now 32-year-old child she had a Tdap.   Laceration      Home Medications Prior to Admission medications   Medication Sig Start Date End Date Taking? Authorizing Provider  furosemide (LASIX) 20 MG tablet Take 1 tablet (20 mg total) by mouth daily. 03/29/22   Gilda Crease, MD  Home[employer] Products [facility] KIDS EAR RELIEF) SOLN Place 5 drops in ear(s) daily as needed (earache).    [provider]  ondansetron (ZOFRAN-ODT) 4 MG disintegrating tablet Take 1 tablet (4 mg total) by mouth every 8 (eight) hours as needed for nausea or vomiting. 01/04/22   Jeanelle Malling, PA  Pseudoephedrine-APAP-DM (DAYQUIL PO) Take 2 capsules by mouth 2 (two) times daily as needed (sinus congestion/headache).    [provider]      Allergies    Lactose intolerance (gi) and Penicillins    Review of Systems   Review of Systems  Physical Exam Updated Vital Signs BP (!) 140/98   Pulse 88   Temp 98.4 F (36.9 C) (Oral)   Resp 16   Ht 5\' 9"  (1.753 m)   Wt 129.7 kg   SpO2 92%   BMI 42.23 kg/m  Physical Exam Vitals and nursing note reviewed.  Constitutional:      General: She is not in acute distress.    Appearance: She is well-developed.  HENT:     Head: Normocephalic and atraumatic.     Mouth/Throat:     Mouth: Mucous membranes are moist.  Eyes:     Conjunctiva/sclera: Conjunctivae normal.  Cardiovascular:      Rate and Rhythm: Normal rate and regular rhythm.     Heart sounds: No murmur heard. Pulmonary:     Effort: Pulmonary effort is normal. No respiratory distress.     Breath sounds: Normal breath sounds.  Musculoskeletal:        General: No swelling.     Comments: Patient can flex and extend all joints of little finger against resistance without difficulty  Skin:    General: Skin is warm and dry.     Capillary Refill: Capillary refill takes less than 2 seconds.     Comments: Triangular shaped laceration to palmar aspect of proximal phalanx of left little finger with mild bleeding  Neurological:     General: No focal deficit present.     Mental Status: She is alert and oriented to person, place, and time.  Psychiatric:        Mood and Affect: Mood normal.     ED Results / Procedures / Treatments   Labs (all labs ordered are listed, but only abnormal results are displayed) Labs Reviewed - No data to display  EKG None  Radiology No results found.  Procedures .Laceration Repair  Date/Time: 01/21/2023 10:50 AM  Performed by: Ma Rings, PA-C Authorized by: Ma Rings, PA-C  Consent:    Consent obtained:  Verbal   Consent given by:  Patient   Risks discussed:  Infection, pain, tendon damage and poor wound healing Universal protocol:    Imaging studies available: yes     Patient identity confirmed:  Verbally with patient Laceration details:    Location:  Hand   Length (cm):  2 Pre-procedure details:    Preparation:  Imaging obtained to evaluate for foreign bodies Exploration:    Hemostasis achieved with:  Direct pressure   Imaging obtained: x-ray     Imaging outcome: foreign body not noted     Wound exploration: wound explored through full range of motion and entire depth of wound visualized     Wound extent: no foreign body, no signs of injury, no tendon damage and no vascular damage     Contaminated: no   Treatment:    Area cleansed with:   Povidone-iodine   Amount of cleaning:  Standard   Irrigation solution:  Sterile saline   Irrigation method:  Syringe Skin repair:    Repair method:  Sutures   Suture size:  5-0   Suture material:  Prolene   Suture technique:  Simple interrupted   Number of sutures:  3 Approximation:    Approximation:  Close Repair type:    Repair type:  Simple Post-procedure details:    Dressing:  Antibiotic ointment and adhesive bandage   Procedure completion:  Tolerated well, no immediate complications     Medications Ordered in ED Medications - No data to display  ED Course/ Medical Decision Making/ A&P                                 Medical Decision Making Differential diagnosis includes but not limited to laceration, abrasion, foreign body, nerve injury, tendon injury, other ED course: Patient here for laceration to the left little finger.  Exam shows full range of motion including flexion and extension against resistance.  Wound is a small V shaped flap and is fairly superficial, sutured as above, advised on wound care follow-up and return precautions.  We discussed that sometimes wounds with a corner have some necrosis and delayed healing.  Amount and/or Complexity of Data Reviewed Radiology: ordered.  Risk OTC drugs. Prescription drug management.           Final Clinical Impression(s) / ED Diagnoses Final diagnoses:  None    Rx / DC Orders ED Discharge Orders     None         Josem Kaufmann 01/21/23 1054    Bethann Berkshire, MD 01/23/23 1004

## 2023-04-13 ENCOUNTER — Other Ambulatory Visit: Payer: Self-pay | Admitting: Unknown Physician Specialty

## 2023-04-13 DIAGNOSIS — Z803 Family history of malignant neoplasm of breast: Secondary | ICD-10-CM

## 2023-05-29 ENCOUNTER — Other Ambulatory Visit: Payer: Self-pay | Admitting: Unknown Physician Specialty

## 2023-05-31 ENCOUNTER — Other Ambulatory Visit

## 2023-10-04 IMAGING — CT CT ABD-PELV W/ CM
2 of 4 series · 16 of 46 positions shown, 18 images · IV contrast (Omnipaque or Isovue)
Comparison: CT abdomen/pelvis 12/05/2011

CLINICAL DATA: Lower abdominal cramping radiating to upper abdomen
nausea, vomiting

EXAM:
CT ABDOMEN AND PELVIS WITH CONTRAST
TECHNIQUE: Multidetector CT imaging of the abdomen and pelvis was performed
using the standard protocol following bolus administration of
intravenous contrast.

[Series 2: axial st · axial · 0.84mm/px · z∈[+957,+1437]mm · 13 of 106 slices shown, 15 images]
[im 5/106  soft-tissue]
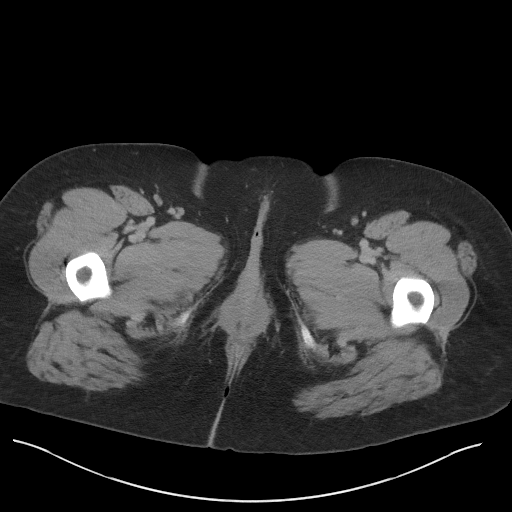
[im 5/106  bone]
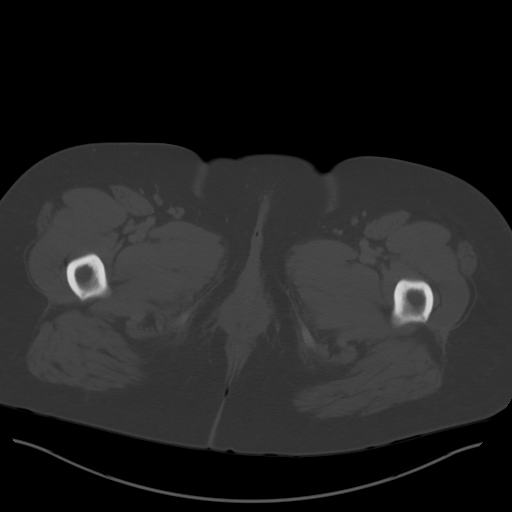
[im 14/106  soft-tissue]
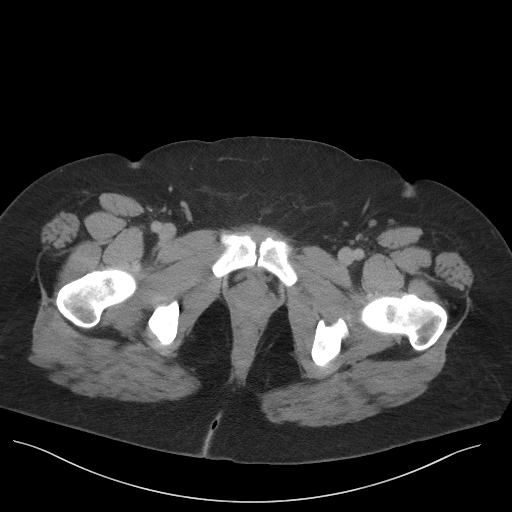
[im 22/106  soft-tissue]
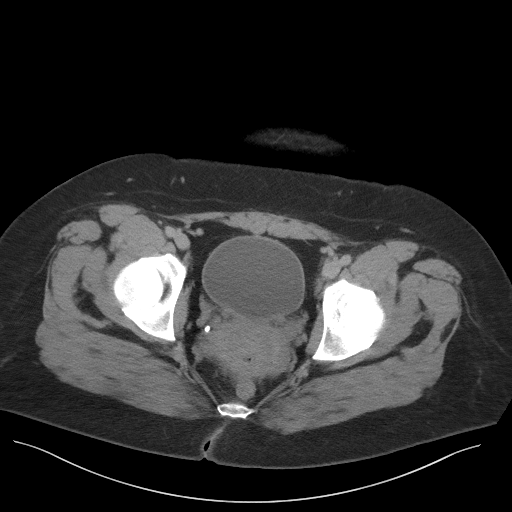
[im 31/106  soft-tissue]
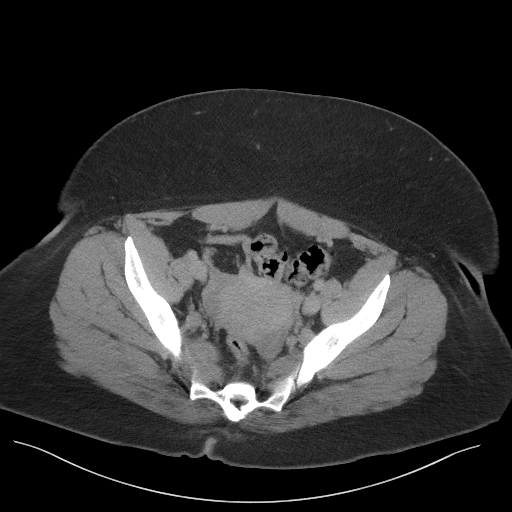
[im 36/106  soft-tissue]
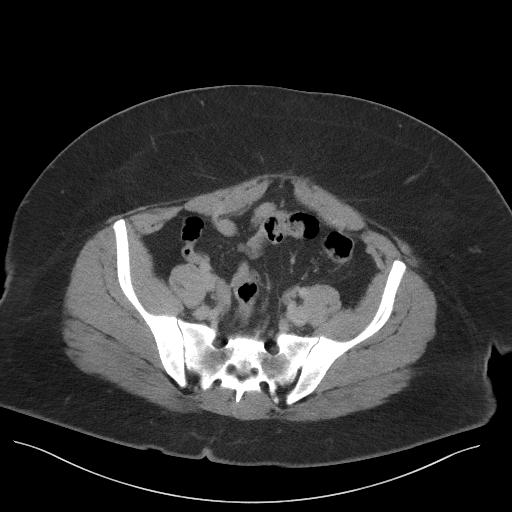
[im 44/106  soft-tissue]
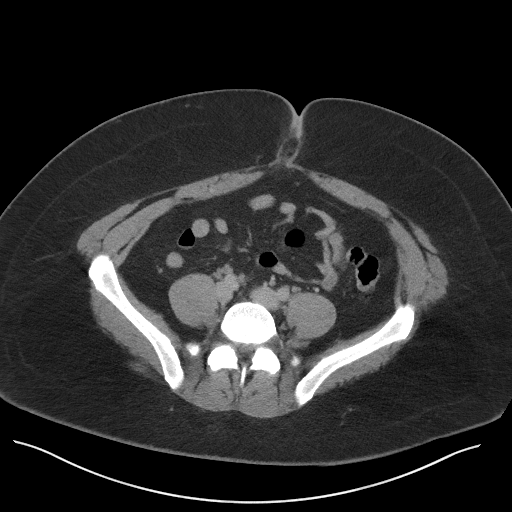
[im 53/106  soft-tissue]
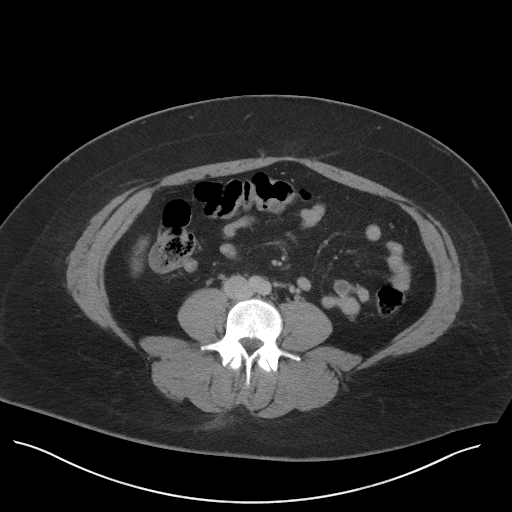
[im 62/106  soft-tissue]
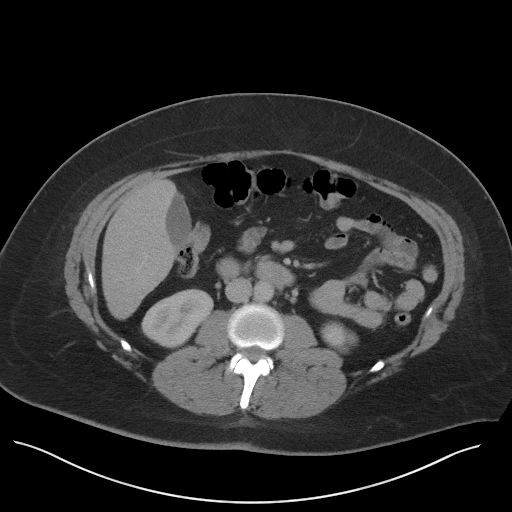
[im 71/106  soft-tissue]
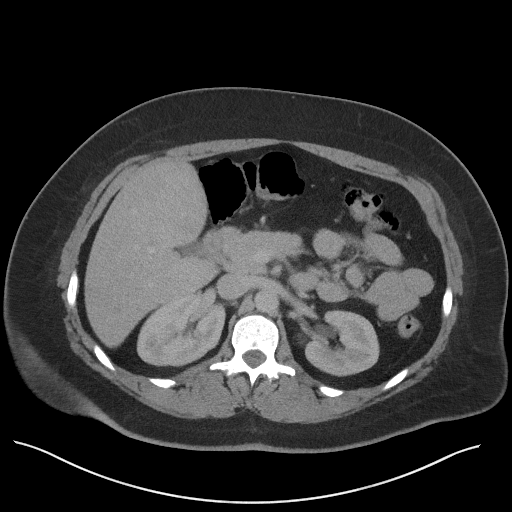
[im 71/106  bone]
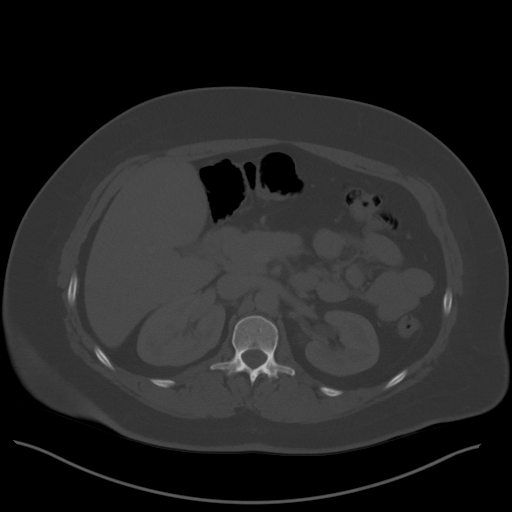
[im 75/106  soft-tissue]
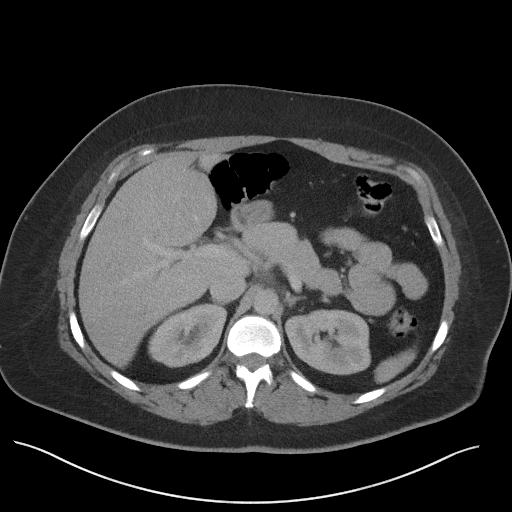
[im 84/106  soft-tissue]
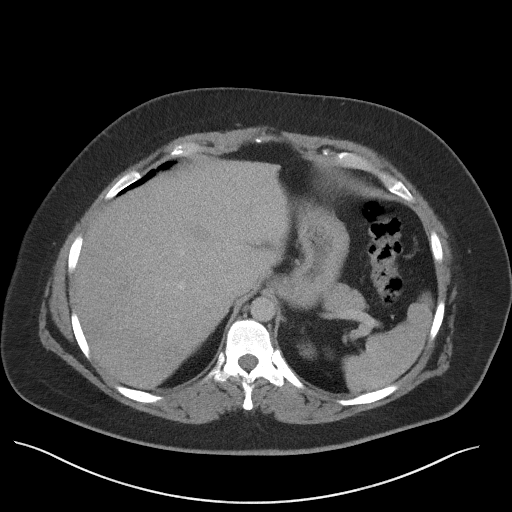
[im 92/106  soft-tissue]
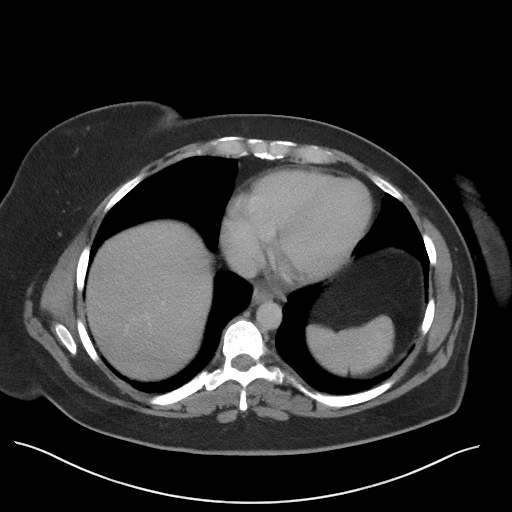
[im 101/106  soft-tissue]
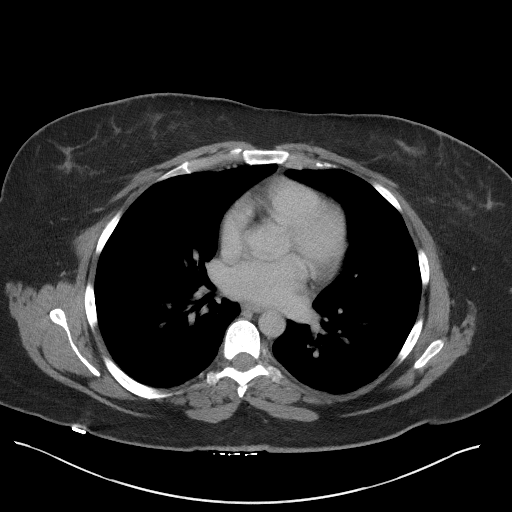

[Series 5: coronal st · coronal · 0.92mm/px · 3 of 117 slices shown]
[im 39/117  soft-tissue]
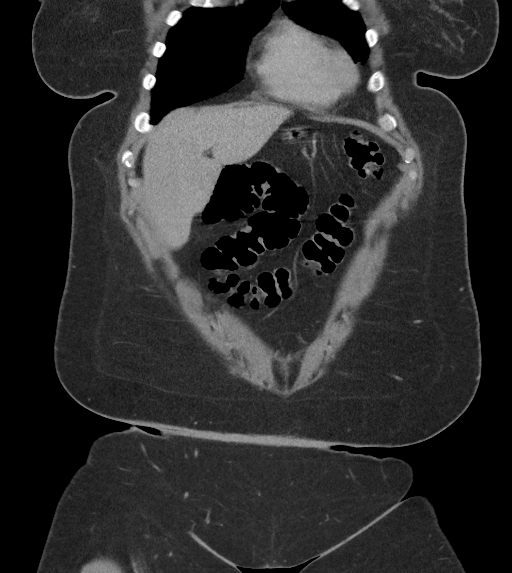
[im 52/117  soft-tissue]
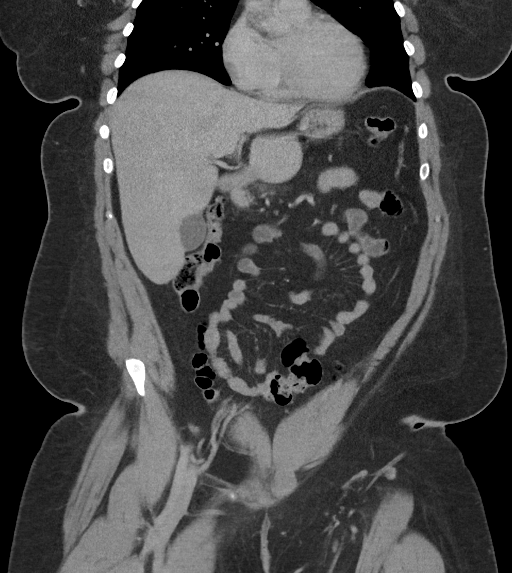
[im 65/117  soft-tissue]
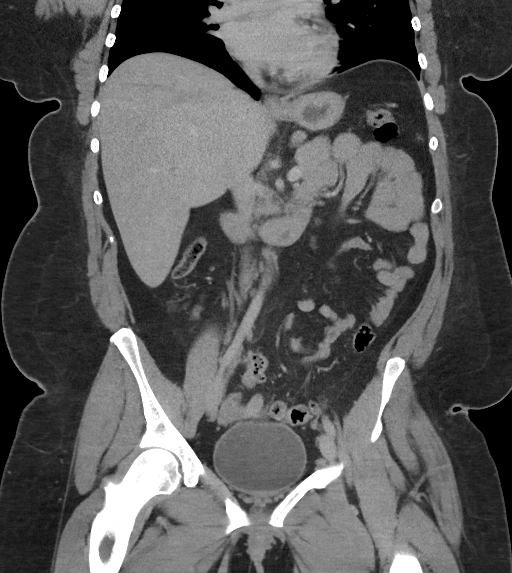

[16 of 46 positions shown; findings below may reference images not displayed]

RADIATION DOSE REDUCTION: This exam was performed according to the
departmental dose-optimization program which includes automated
exposure control, adjustment of the mA and/or kV according to
patient size and/or use of iterative reconstruction technique.

CONTRAST:  100mL OMNIPAQUE IOHEXOL 300 MG/ML  SOLN
FINDINGS: Lower chest: The lung bases are clear. The imaged heart is
unremarkable.

Hepatobiliary: There is a subcentimeter hypodense lesion in the left
hepatic lobe, too small to characterize but most likely reflects a
small cyst for which no specific imaging follow-up is required. The
liver is otherwise unremarkable. The gallbladder is unremarkable.
There is no biliary ductal dilatation.

Pancreas: Unremarkable.

Spleen: Unremarkable.

Adrenals/Urinary Tract: The adrenals are unremarkable.

The kidneys are unremarkable, with no focal lesion, stone,
hydronephrosis, or hydroureter. Bladder is unremarkable.

Stomach/Bowel: The stomach is unremarkable. There is no evidence of
bowel obstruction. There is no abnormal bowel wall thickening or
inflammatory change. The appendix is normal.

Vascular/Lymphatic: Abdominal aorta is normal in course and caliber.
The major branch vessels appear patent. The main portal and splenic
veins are patent.

There is a 1.2 cm lymph node along the lesser curvature of the
stomach which is stable to decreased in size since 8021. There is no
new or progressive lymphadenopathy in the abdomen or pelvis.

Reproductive: The uterus and adnexa are unremarkable.

Other: There is no ascites or free air. There is a fat containing
umbilical hernia, unchanged.

Musculoskeletal: There is no acute osseous abnormality or suspicious
osseous lesion.
IMPRESSION: No acute findings in the abdomen or pelvis to explain the patient's
symptoms.
